# Patient Record
Sex: Male | Born: 1970
Health system: Southern US, Community
[De-identification: ages and names within clinical notes are randomized; demographics above are authoritative.]

## PROBLEM LIST (undated history)

## (undated) DIAGNOSIS — K4090 Unilateral inguinal hernia, without obstruction or gangrene, not specified as recurrent: Secondary | ICD-10-CM

## (undated) DIAGNOSIS — F329 Major depressive disorder, single episode, unspecified: Secondary | ICD-10-CM

## (undated) DIAGNOSIS — F419 Anxiety disorder, unspecified: Secondary | ICD-10-CM

## (undated) DIAGNOSIS — F32A Depression, unspecified: Secondary | ICD-10-CM

## (undated) DIAGNOSIS — I10 Essential (primary) hypertension: Secondary | ICD-10-CM

## (undated) DIAGNOSIS — F1910 Other psychoactive substance abuse, uncomplicated: Secondary | ICD-10-CM

## (undated) HISTORY — PX: MOUTH SURGERY: SHX715

## (undated) HISTORY — DX: Other psychoactive substance abuse, uncomplicated: F19.10

## (undated) HISTORY — DX: Major depressive disorder, single episode, unspecified: F32.9

## (undated) HISTORY — PX: HERNIA REPAIR: SHX51

## (undated) HISTORY — DX: Anxiety disorder, unspecified: F41.9

## (undated) HISTORY — DX: Depression, unspecified: F32.A

## (undated) HISTORY — DX: Essential (primary) hypertension: I10

---

## 2000-08-30 HISTORY — PX: KNEE ARTHRODESIS: SHX1857

## 2011-12-11 ENCOUNTER — Encounter (HOSPITAL_COMMUNITY): Payer: Self-pay

## 2011-12-11 ENCOUNTER — Emergency Department (HOSPITAL_COMMUNITY)
Admission: EM | Admit: 2011-12-11 | Discharge: 2011-12-11 | Disposition: A | Discharge status: Home / Self Care | Payer: Self-pay | Source: Home / Self Care | Attending: Emergency Medicine | Admitting: Emergency Medicine

## 2011-12-11 DIAGNOSIS — I10 Essential (primary) hypertension: Secondary | ICD-10-CM

## 2011-12-11 DIAGNOSIS — K047 Periapical abscess without sinus: Secondary | ICD-10-CM

## 2011-12-11 MED ORDER — PENICILLIN V POTASSIUM 500 MG PO TABS: 500.0000 mg | ORAL_TABLET | Freq: Four times a day (QID) | ORAL | Status: AC

## 2011-12-11 MED ORDER — AMLODIPINE BESYLATE 10 MG PO TABS: 10.0000 mg | ORAL_TABLET | Freq: Every day | ORAL | Status: DC

## 2011-12-11 MED ORDER — DICLOFENAC SODIUM 75 MG PO TBEC: 75.0000 mg | DELAYED_RELEASE_TABLET | Freq: Two times a day (BID) | ORAL | Status: AC

## 2011-12-11 MED ORDER — CLONIDINE HCL 0.1 MG PO TABS
ORAL_TABLET | ORAL | Status: AC
  Filled 2011-12-11: qty 2

## 2011-12-11 MED ORDER — HYDROCODONE-ACETAMINOPHEN 5-325 MG PO TABS: ORAL_TABLET | ORAL | Status: AC

## 2011-12-11 MED ORDER — CLONIDINE HCL 0.1 MG PO TABS
0.2000 mg | ORAL_TABLET | Freq: Once | ORAL | Status: AC
  Administered 2011-12-11: 0.2 mg via ORAL

## 2011-12-11 NOTE — Discharge Instructions (Signed)
Look up the [facility]'s [facility] for free dental clinics. [email or web address]  Get there early and be prepared to wait. Caralyn Guile and [employer] have Armed forces operational officer schools that provide low cost routine dental care.   Other resources: [facility] [address] [place], [place] [phone]  Patients with Medicaid: [facility] [phone]                                [phone] [address] [employer] Phone:  [phone]                                                  Phone:  [phone]  If unable to pay or uninsured, contact:  [facility] or [facility]. to become qualified for the adult dental clinic.  No matter what dental problem you have, it will not get better unless you get good dental care.  If the tooth is not taken care of, your symptoms will come back in time and you will be visiting [place] again in the Urgent Care Center with a bad toothache.  So, see your dentist as soon as possible.  If you don't have a dentist, we can give you a list of dentists.  Sometimes the most cost effective treatment is removal of the tooth.  This can be done very inexpensively through one of the low cost Runner, broadcasting/film/video such as the facility on [facility] in [place] [phone]).  The downside to this is that you will have one less tooth and this can effect your ability to chew.  Some other things that can be done for a dental infection include the following:   Rinse your mouth out with hot salt water (1/2 tsp of table salt and a pinch of baking soda in 8 oz of hot water).  You can do this every 2 or 3 hours.  Avoid cold foods, beverages, and cold air.  This will make your symptoms worse.  Sleep with your head elevated.  Sleeping flat will cause your gums and oral tissues to swell and make them hurt more.  You can sleep on several pillows.  Even  better is to sleep in a recliner with your head higher than your heart.  For mild to moderate pain, you can take Tylenol, ibuprofen, or Aleve.  External application of heat by a heating pad, hot water bottle, or hot wet towel can help with pain and speed healing.  You can do this every 2 to 3 hours. Do not fall asleep on a heating pad since this can cause a burn.    Blood pressure over the ideal can put you at higher risk for stroke, heart disease, and kidney failure.  For this reason, it's important to try to get your blood pressure as close as possible to the ideal.  The ideal blood pressure is 120/80.  Blood pressures from 120-139 systolic over 80-89 diastolic are labeled as "prehypertension."  This means you are at higher risk of developing hypertension in the future.  Blood pressures in this range are not treated with medication, but lifestyle changes  are recommended to prevent progression to hypertension.  Blood pressures of 140 and above systolic over 90 and above diastolic are classified as hypertension and are treated with medications.  Lifestyle changes which can benefit both prehypertension and hypertension include the following:   Salt and sodium restriction.  Weight loss.  Regular exercise.  Avoidance of tobacco.  Avoidance of excess alcohol.  The "D.A.S.H" diet.   People with hypertension and prehypertension should limit their salt intake to less than 1500 mg daily.  Reading the nutrition information on the label of many prepared foods can give you an idea of how much sodium you're consuming at each meal.  Remember that the most important number on the nutrition information is the serving size.  It may be smaller than you think.  Try to avoid adding extra salt at the table.  You may add small amounts of salt while cooking.  Remember that salt is an acquired taste and you may get used to a using a whole lot less salt than you are using now.  Using less salt lets the food's  natural flavors come through.  You might want to consider using salt substitutes, potassium chloride, pepper, or blends of herbs and spices to enhance the flavor of your food.  Foods that contain the most salt include: processed meats (like ham, bacon, lunch meat, sausage, hot dogs, and breakfast meat), chips, pretzels, salted nuts, soups, salty snacks, canned foods, junk food, fast food, restaurant food, mustard, pickles, pizza, popcorn, soy sauce, and worcestershire sauce--quite a list!  You might ask, "Is there anything I can eat?"  The answer is, "yes."  Fruits and vegetables are usually low in salt.  Fresh is better than frozen which is better than canned.  If you have canned vegetables, you can cut down on the salt content by rinsing them in tap water 3 times before cooking.     Weight loss is the second thing you can do to lower your blood pressure.  Getting to and maintaining ideal weight will often normalize your blood pressure and allow you to avoid medications, entirely, cut way down on your dosage of medications, or allow to wean off your meds.  (Note, this should only be done under the supervision of your primary care doctor.)  Of course, weight loss takes time and you may need to be on medication in the meantime.  You shoot for a body mass index of 20-25.  When you go to the urgent care or to your primary care doctor, they should calculate your BMI.  If you don't know what it is, ask.  You can calculate your BMI with the following formula:  Weight in pounds x 703/ (height in inches) x (height in inches).  There are many good diets out there: Weight Watchers and the D.A.S.H. Diet are the best, but often, just modifying a few factors can be helpful:  Don't skip meals, don't eat out, and keeping a food diary.  I do not recommend fad diets or diet pills which often raise blood pressure.    Everyone should get regular exercise, but this is particularly important for people with high blood pressure.   Just about any exercise is good.  The only exercise which may be harmful is lifting extreme heavy weights.  I recommend moderate exercise such as walking for 30 minutes 5 days a week.  Going to the gym for a 50 minute workout 3 times a week is also good.  This amounts to  150 minutes of exercise weekly.   Anyone with high blood pressure should avoid any use of tobacco.  Tobacco use does not elevate blood pressure, but it increases the risk of heart disease and stroke.  If you are interested in quitting, discuss with your doctor how to quit.  If you are not interested in quitting, ask yourself, "What would my life be like in 10 years if I continue to smoke?"  "How will I know when it is time to quit?"  "How would my life be better if I were to quit."   Excess alcohol intake can raise the blood pressure.  The safe alcohol intake is 2 drinks or less per day for men and 1 drink per day or less for women.   There is a very good diet which I recommend that has been designed for people with blood pressure called the D.A.S.H. Diet (dietary approaches to stop hypertension).  It consists of fruits, vegetables, lean meats, low fat dairy, whole grains, nuts and seeds.  It is very low in salt and sodium.  It has also been found to have other beneficial health effects such as lowering cholesterol and helping lose weight.  It has been developed by the [employer] and can be downloaded from the internet without any cost. Just do a Programmer, multimedia on "D.A.S.H. Diet." or go the NIH website (GolfingPosters.tn).  There are also cookbooks and diet plans that can be gotten from [place] to help you with this diet.    Go to www.goodrx.com to look up your medications. This will give you a list of where you can find your prescriptions at the most affordable prices.   RESOURCE GUIDE  Dental Problems  Look up DebtSupply.pl.asp for a schedule of the [facility] [facility]'s free dental clinics  called [address] of [place]. They have clinics all around [place]. Get there early and be prepared to wait.   [facility] [address]  [place], [place] [address] [phone]  [facility] [address] [place], [place] [phone]  Patients with Medicaid: [facility] [phone] [address]                                [phone] [address] [employer] Phone:  [phone]                                                  Phone:  [phone]  If unable to pay or uninsured, contact:  [facility] or [facility]. to become qualified for the adult dental clinic.   Go to www.goodrx.com to look up your medications. This will give you a list of where you can find your prescriptions at the most affordable prices.   RESOURCE GUIDE  Chronic Pain Problems Contact [facility]  [phone] Patients need to be referred by their primary care doctor.  Insufficient Money for Medicine Contact [employer]:  call "211" or [facility] [phone].  No Primary Care Doctor Call [facility]  [phone] Other agencies that provide inexpensive medical care    [facility]  [phone]    [facility]  [phone]    [facility]  [phone]    [facility]  [phone] [address] [place] [address]    [facility]  [phone]    [facility]  [phone] [facility] [phone]   [address] [place]. [address] [place], [place] [address]  Psychological Services [facility]  [phone] [facility]  [phone] [facility]   [phone] (emergency services [phone])  Substance Abuse Resources [facility]  [phone] [facility] [phone] [facility] [phone] Floydene Flock [phone] Residential & Outpatient Substance  Abuse Program  [phone]  Abuse/Neglect [facility] [place] Child Abuse Hotline (479)517-4327 [facility] Child Abuse Hotline (309)736-5715 (After Hours)  Emergency Shelter [facility] [phone]  Maternity Homes Room at the [place] [facility] [place] [phone] [facility] [phone]  MRSA Hotline #:   7432999839                                                Cristobal Goldmann Phone:  [phone]                                   Phone:  [phone]                 Phone:  [phone]  [facility] Phone:  [phone]  [facility] Child Abuse Hotline [phone]    [facility] Resources  [facility] [place]     [employer]                          [facility] [address] [place]                       [address]      [address] [place] [address]   [phone] (After Hours)

## 2011-12-11 NOTE — ED Provider Notes (Signed)
Chief Complaint  Patient presents with  . Dental Pain    History of Present Illness:   George Campbell is a 41 year old male with high blood pressure who has had a one-week history of a toothache in his left upper second premolar. There is no swelling, but he does have a bad taste in his mouth. It hurts to and he has some sinus pain. No trouble swallowing or breathing. No headache, fever, chills, or shortness of breath.  He has a history of high blood pressure, but is not now on any blood pressure medication. He denies headaches, dizziness, shortness of breath, or chest pain.  Review of Systems:  Other than noted above, the patient denies any of the following symptoms: Systemic:  No fever, chills, sweats or weight loss. ENT:  No headache, ear ache, sore throat, nasal congestion, facial pain, or swelling. Lymphatic:  No adenopathy. Lungs:  No coughing, wheezing or shortness of breath.  PMFSH:  Past medical history, family history, social history, meds, and allergies were reviewed.  Physical Exam:   Vital signs:  BP 157/109  Pulse 84  Temp(Src) 97.8 F (36.6 C) (Oral)  Resp 20  SpO2 99% Filed Vitals:   12/11/11 1931 12/11/11 1932 12/11/11 2003  BP: 205/120 190/133 157/109  Pulse: 84    Temp: 97.8 F (36.6 C)    TempSrc: Oral    Resp: 20    SpO2: 99%      General:  Alert, oriented, in no distress. ENT:  TMs and canals normal.  Nasal mucosa normal. Mouth exam:  He has multiple carious teeth. There is a large cavity in the left, upper, second premolar. No swelling of the gingiva, no purulent drainage, no swelling of the floor the mouth, and the pharynx is clear. Neck:  No swelling or adenopathy. Lungs:  Breath sounds clear and equal bilaterally.  No wheezes, rales or rhonchi. Heart:  Regular rhythm.  No gallops or murmers. Skin:  Clear, warm and dry.  Course in Urgent Care Center:   He was given clonidine 0.2 mg by mouth and his blood pressure came down as demonstrated  above.  Assessment:  The primary encounter diagnosis was Dental abscess. A diagnosis of Hypertension was also pertinent to this visit.  Plan:   1.  The following meds were prescribed:   New Prescriptions   AMLODIPINE (NORVASC) 10 MG TABLET    Take 1 tablet (10 mg total) by mouth daily.   DICLOFENAC (VOLTAREN) 75 MG EC TABLET    Take 1 tablet (75 mg total) by mouth 2 (two) times daily.   HYDROCODONE-ACETAMINOPHEN (NORCO) 5-325 MG PER TABLET    1 to 2 tabs every 4 to 6 hours as needed for pain.   PENICILLIN V POTASSIUM (VEETID) 500 MG TABLET    Take 1 tablet (500 mg total) by mouth 4 (four) times daily.   2.  The patient was instructed in symptomatic care and handouts were given. 3.  The patient was told to return if becoming worse in any way, if no better in 3 or 4 days, and given some red flag symptoms that would indicate earlier return, especially difficulty breathing. 4.  The patient was told to follow up with a dentist as soon as possible. He was also told to followup with her primary care doctor as soon as possible with regard to his blood pressure.    Reuben Likes, MD 12/11/11 [phone]

## 2011-12-11 NOTE — ED Notes (Signed)
Pt has lt sided toothache for one week.  Pts b/p is elevated and states he has been on b/p medication in the past but is presently not taking any medication.

## 2013-04-17 DIAGNOSIS — F988 Other specified behavioral and emotional disorders with onset usually occurring in childhood and adolescence: Secondary | ICD-10-CM | POA: Insufficient documentation

## 2013-08-02 DIAGNOSIS — E6609 Other obesity due to excess calories: Secondary | ICD-10-CM | POA: Insufficient documentation

## 2013-08-02 DIAGNOSIS — E66811 Obesity, class 1: Secondary | ICD-10-CM | POA: Insufficient documentation

## 2013-08-02 DIAGNOSIS — Z6834 Body mass index (BMI) 34.0-34.9, adult: Secondary | ICD-10-CM

## 2013-08-02 DIAGNOSIS — I10 Essential (primary) hypertension: Secondary | ICD-10-CM | POA: Insufficient documentation

## 2015-03-25 DIAGNOSIS — E119 Type 2 diabetes mellitus without complications: Secondary | ICD-10-CM | POA: Insufficient documentation

## 2015-03-25 DIAGNOSIS — E1159 Type 2 diabetes mellitus with other circulatory complications: Secondary | ICD-10-CM | POA: Insufficient documentation

## 2015-03-25 DIAGNOSIS — R7301 Impaired fasting glucose: Secondary | ICD-10-CM | POA: Insufficient documentation

## 2015-03-26 LAB — HM HEPATITIS C SCREENING LAB: HM HEPATITIS C SCREENING: NEGATIVE

## 2015-10-05 DIAGNOSIS — K700 Alcoholic fatty liver: Secondary | ICD-10-CM | POA: Insufficient documentation

## 2016-07-09 DIAGNOSIS — F988 Other specified behavioral and emotional disorders with onset usually occurring in childhood and adolescence: Secondary | ICD-10-CM | POA: Diagnosis not present

## 2016-07-09 DIAGNOSIS — E669 Obesity, unspecified: Secondary | ICD-10-CM | POA: Diagnosis not present

## 2016-07-09 DIAGNOSIS — Z87891 Personal history of nicotine dependence: Secondary | ICD-10-CM | POA: Diagnosis not present

## 2016-07-09 DIAGNOSIS — I10 Essential (primary) hypertension: Secondary | ICD-10-CM | POA: Diagnosis not present

## 2016-09-14 DIAGNOSIS — Z23 Encounter for immunization: Secondary | ICD-10-CM | POA: Diagnosis not present

## 2016-09-14 DIAGNOSIS — F988 Other specified behavioral and emotional disorders with onset usually occurring in childhood and adolescence: Secondary | ICD-10-CM | POA: Diagnosis not present

## 2016-09-14 DIAGNOSIS — I10 Essential (primary) hypertension: Secondary | ICD-10-CM | POA: Diagnosis not present

## 2016-09-14 DIAGNOSIS — Z0001 Encounter for general adult medical examination with abnormal findings: Secondary | ICD-10-CM | POA: Diagnosis not present

## 2016-09-14 DIAGNOSIS — R7301 Impaired fasting glucose: Secondary | ICD-10-CM | POA: Diagnosis not present

## 2016-09-14 LAB — CBC AND DIFFERENTIAL
HEMATOCRIT: 44 (ref 41–53)
HEMOGLOBIN: 15.2 (ref 13.5–17.5)
WBC: 11.2

## 2016-09-14 LAB — HEMOGLOBIN A1C
Hemoglobin A1C: 5.7
Hemoglobin A1C: 5.7

## 2016-09-14 LAB — LIPID PANEL
CHOLESTEROL: 134 (ref 0–200)
HDL: 52 (ref 35–70)
LDL Cholesterol: 53
TRIGLYCERIDES: 147 (ref 40–160)

## 2016-09-14 LAB — HEPATIC FUNCTION PANEL
ALT: 47 — AB (ref 10–40)
AST: 40 (ref 14–40)

## 2016-11-17 DIAGNOSIS — J069 Acute upper respiratory infection, unspecified: Secondary | ICD-10-CM | POA: Diagnosis not present

## 2016-12-14 DIAGNOSIS — F988 Other specified behavioral and emotional disorders with onset usually occurring in childhood and adolescence: Secondary | ICD-10-CM | POA: Diagnosis not present

## 2016-12-14 DIAGNOSIS — I10 Essential (primary) hypertension: Secondary | ICD-10-CM | POA: Diagnosis not present

## 2017-06-15 DIAGNOSIS — E6609 Other obesity due to excess calories: Secondary | ICD-10-CM | POA: Diagnosis not present

## 2017-06-15 DIAGNOSIS — Z6834 Body mass index (BMI) 34.0-34.9, adult: Secondary | ICD-10-CM | POA: Diagnosis not present

## 2017-06-15 DIAGNOSIS — F988 Other specified behavioral and emotional disorders with onset usually occurring in childhood and adolescence: Secondary | ICD-10-CM | POA: Diagnosis not present

## 2017-06-15 DIAGNOSIS — I10 Essential (primary) hypertension: Secondary | ICD-10-CM | POA: Diagnosis not present

## 2017-09-26 ENCOUNTER — Encounter: Payer: Self-pay | Admitting: Family Medicine

## 2017-09-26 ENCOUNTER — Other Ambulatory Visit: Payer: Self-pay | Admitting: *Deleted

## 2017-09-26 ENCOUNTER — Encounter: Payer: Self-pay | Admitting: *Deleted

## 2017-09-26 ENCOUNTER — Ambulatory Visit (INDEPENDENT_AMBULATORY_CARE_PROVIDER_SITE_OTHER): Payer: BLUE CROSS/BLUE SHIELD | Admitting: Family Medicine

## 2017-09-26 VITALS — BP 124/90 | HR 96 | Temp 99.0°F | Ht 69.0 in | Wt 236.2 lb

## 2017-09-26 DIAGNOSIS — Z0000 Encounter for general adult medical examination without abnormal findings: Secondary | ICD-10-CM | POA: Diagnosis not present

## 2017-09-26 DIAGNOSIS — I10 Essential (primary) hypertension: Secondary | ICD-10-CM | POA: Diagnosis not present

## 2017-09-26 DIAGNOSIS — Z114 Encounter for screening for human immunodeficiency virus [HIV]: Secondary | ICD-10-CM

## 2017-09-26 DIAGNOSIS — F988 Other specified behavioral and emotional disorders with onset usually occurring in childhood and adolescence: Secondary | ICD-10-CM | POA: Diagnosis not present

## 2017-09-26 DIAGNOSIS — R7301 Impaired fasting glucose: Secondary | ICD-10-CM

## 2017-09-26 DIAGNOSIS — K760 Fatty (change of) liver, not elsewhere classified: Secondary | ICD-10-CM

## 2017-09-26 LAB — CBC WITH DIFFERENTIAL/PLATELET
BASOS ABS: 0.1 10*3/uL (ref 0.0–0.1)
Basophils Relative: 0.6 % (ref 0.0–3.0)
Eosinophils Absolute: 0.1 10*3/uL (ref 0.0–0.7)
Eosinophils Relative: 0.7 % (ref 0.0–5.0)
HCT: 47.5 % (ref 39.0–52.0)
Hemoglobin: 16.3 g/dL (ref 13.0–17.0)
LYMPHS ABS: 2.1 10*3/uL (ref 0.7–4.0)
Lymphocytes Relative: 19 % (ref 12.0–46.0)
MCHC: 34.3 g/dL (ref 30.0–36.0)
MCV: 90.4 fl (ref 78.0–100.0)
MONO ABS: 1.4 10*3/uL — AB (ref 0.1–1.0)
MONOS PCT: 12.4 % — AB (ref 3.0–12.0)
NEUTROS ABS: 7.6 10*3/uL (ref 1.4–7.7)
NEUTROS PCT: 67.3 % (ref 43.0–77.0)
PLATELETS: 277 10*3/uL (ref 150.0–400.0)
RBC: 5.26 Mil/uL (ref 4.22–5.81)
RDW: 13.1 % (ref 11.5–15.5)
WBC: 11.3 10*3/uL — ABNORMAL HIGH (ref 4.0–10.5)

## 2017-09-26 LAB — LIPID PANEL
CHOL/HDL RATIO: 3
Cholesterol: 140 mg/dL (ref 0–200)
HDL: 54.9 mg/dL (ref >39.00)
LDL Cholesterol: 56 mg/dL (ref 0–99)
NONHDL: 85.05
Triglycerides: 147 mg/dL (ref 0.0–149.0)
VLDL: 29.4 mg/dL (ref 0.0–40.0)

## 2017-09-26 LAB — COMPREHENSIVE METABOLIC PANEL
ALT: 148 U/L — ABNORMAL HIGH (ref 0–53)
AST: 244 U/L — ABNORMAL HIGH (ref 0–37)
Albumin: 4.8 g/dL (ref 3.5–5.2)
Alkaline Phosphatase: 77 U/L (ref 39–117)
BUN: 13 mg/dL (ref 6–23)
CO2: 30 meq/L (ref 19–32)
Calcium: 9.8 mg/dL (ref 8.4–10.5)
Chloride: 96 mEq/L (ref 96–112)
Creatinine, Ser: 0.8 mg/dL (ref 0.40–1.50)
GFR: 111.15 mL/min (ref >60.00)
GLUCOSE: 139 mg/dL — AB (ref 70–99)
POTASSIUM: 4.8 meq/L (ref 3.5–5.1)
SODIUM: 135 meq/L (ref 135–145)
Total Bilirubin: 1.2 mg/dL (ref 0.2–1.2)
Total Protein: 8.4 g/dL — ABNORMAL HIGH (ref 6.0–8.3)

## 2017-09-26 LAB — HEMOGLOBIN A1C: Hgb A1c MFr Bld: 6 % (ref 4.6–6.5)

## 2017-09-26 MED ORDER — AMPHETAMINE-DEXTROAMPHETAMINE 10 MG PO TABS: 10.0000 mg | ORAL_TABLET | Freq: Every day | ORAL | 0 refills | Status: DC | PRN

## 2017-09-26 MED ORDER — LISINOPRIL-HYDROCHLOROTHIAZIDE 20-12.5 MG PO TABS: 2.0000 | ORAL_TABLET | Freq: Every day | ORAL | 3 refills | Status: DC

## 2017-09-26 MED ORDER — AMPHETAMINE-DEXTROAMPHET ER 30 MG PO CP24: 30.0000 mg | ORAL_CAPSULE | Freq: Every day | ORAL | 0 refills | Status: DC

## 2017-09-26 MED ORDER — AMLODIPINE BESYLATE 10 MG PO TABS: 10.0000 mg | ORAL_TABLET | Freq: Every day | ORAL | 0 refills | Status: DC

## 2017-09-26 NOTE — Patient Instructions (Addendum)
It was so good seeing you again! Thank you for establishing with my new practice and allowing me to continue caring for you. It means a lot to me.   Please schedule a follow up appointment with me in 3 months for ADD recheck. Please cancel your appointment for next week. It is no longer needed :).  Start exercising.  Please do these things to maintain good health!   Exercise at least 30-45 minutes a day,  4-5 days a week.   Eat a low-fat diet with lots of fruits and vegetables, up to 7-9 servings per day.  Drink plenty of water daily. Try to drink 8 8oz glasses per day.  Seatbelts can save your life. Always wear your seatbelt.  Place Smoke Detectors on every level of your home and check batteries every year.  Eye Doctor - have an eye exam every 1-2 years  Safe sex - use condoms to protect yourself from STDs if you could be exposed to these types of infections.  Avoid heavy alcohol use. If you drink, keep it to less than 2 drinks/day and not every day.  [place].  Choose someone you trust that could speak for you if you became unable to speak for yourself.  Depression is common in our stressful world.If you're feeling down or losing interest in things you normally enjoy, please come in for a visit.   Exercise to Lose Weight Exercise and a healthy diet may help you lose weight. Your doctor may suggest specific exercises. EXERCISE IDEAS AND TIPS  Choose low-cost things you enjoy doing, such as walking, bicycling, or exercising to workout videos.  Take stairs instead of the elevator.  Walk during your lunch break.  Park your car further away from work or school.  Go to a gym or an exercise class.  Start with 5 to 10 minutes of exercise each day. Build up to 30 minutes of exercise 4 to 6 days a week.  Wear shoes with good support and comfortable clothes.  Stretch before and after working out.  Work out until you breathe harder and your heart beats  faster.  Drink extra water when you exercise.  Do not do so much that you hurt yourself, feel dizzy, or get very short of breath. Exercises that burn about 150 calories:  Running 1  miles in 15 minutes.  Playing volleyball for 45 to 60 minutes.  Washing and waxing a car for 45 to 60 minutes.  Playing touch football for 45 minutes.  Walking 1  miles in 35 minutes.  Pushing a stroller 1  miles in 30 minutes.  Playing basketball for 30 minutes.  Raking leaves for 30 minutes.  Bicycling 5 miles in 30 minutes.  Walking 2 miles in 30 minutes.  Dancing for 30 minutes.  Shoveling snow for 15 minutes.  Swimming laps for 20 minutes.  Walking up stairs for 15 minutes.  Bicycling 4 miles in 15 minutes.  Gardening for 30 to 45 minutes.  Jumping rope for 15 minutes.  Washing windows or floors for 45 to 60 minutes.

## 2017-09-26 NOTE — Progress Notes (Signed)
Subjective  Chief Complaint  Patient presents with  . Establish Care    Transfer from [place]  . ADD  . Hypertension    HPI: George Campbell is a 47 y.o. male who presents to [place] at [facility] today for a Male Wellness Visit. He also has the concerns and/or needs as listed above in the chief complaint. These will be addressed in addition to the Health Maintenance Visit. He is a former [facility] patient and is here to reestablish care with me today.  Reviewed notes from last year and most recent labs: 08/2016  Wellness Visit: annual visit with health maintenance review and exam    Doing well. Finally working day shift instead of night shift at [employer]. Feels better. However, is walking less and has gained about 6 pounds. Due for eye exam. Still using e-cigarettes while driving but no cigarettes in almost 2 years.  Lifestyle: Body mass index is 34.89 kg/m. Wt Readings from Last 3 Encounters:  09/26/17 236 lb 4 oz (107.2 kg)   Diet: general Exercise: rarely, walking  Chronic disease management visit and/or acute problem visit:  Obesity: discussed diet and need for exercise. BMI remains elevated. H/o fatty liver needs recheck. At risk for liver disease and diabetes.   Hypertension f/u: Control is fair . Pt reports he is doing well. taking medications as instructed, no medication side effects noted, home BP monitoring in range of 001V systolic over 49-44H diastolic, no TIAs, no chest pain on exertion, no dyspnea on exertion, no swelling of ankles. He denies adverse effects from his BP medications. Compliance with medication is good. Reviewed bp from last 3 visits: NH - at goal.  Patient is here today for follow up of ADD/ADHD. He is taking medication as directed and continues to feel it is beneficial. The medications continue to help with focus and attention and task completion. He denies adverse side effects; specifically no headaches, appetite suppression, weight loss,  sleeping difficulty, heart palpitations, chest pain or significant weight changes. This patient does not have contraindications for stimulant use include uncontrolled hypertension, tachycardia, arrhythmia, psychosis, bipolar disorder, severe anorexia, and Tourette syndrome.  IFG: due for recheck. Not fasting this am. Will check a1c. Recommend weight loss and to start exercise program. Pt declines nutrition eval at this time.   BP Readings from Last 3 Encounters:  09/26/17 124/90  12/11/11 (!) 157/109   Wt Readings from Last 3 Encounters:  09/26/17 236 lb 4 oz (107.2 kg)    Patient Active Problem List   Diagnosis Date Noted  . Fatty liver disease, nonalcoholic 67/59/1638  . IFG (impaired fasting glucose) 03/25/2015  . Class 1 obesity due to excess calories with serious comorbidity and body mass index (BMI) of 34.0 to 34.9 in adult 08/02/2013  . Essential hypertension with goal blood pressure less than 140/90 08/02/2013  . Attention deficit disorder (ADD) without hyperactivity 04/17/2013   Health Maintenance  Topic Date Due  . HIV Screening  10/27/1987  . TETANUS/TDAP  09/06/2024  . INFLUENZA VACCINE  Completed   Immunization History  Administered Date(s) Administered  . Influenza Nasal 05/15/2013, 05/27/2014  . Influenza, Quadrivalent, Recombinant, Inj, Pf 06/10/2017  . Influenza, Seasonal, Injecte, Preservative Fre 06/02/2015, 05/31/2016  . Pneumococcal Polysaccharide-23 09/14/2016  . Tdap 09/06/2014   We updated and reviewed the patient's past history in detail and it is documented below. Allergies: Patient has No Known Allergies. Past Medical History  has a past medical history of Anxiety, Depression, and Hypertension. Past  Surgical History Patient  has a past surgical history that includes Knee arthrodesis (Right, 2002). Social History Patient  reports that he quit smoking about 21 months ago. His smoking use included cigarettes. He has a 45.00 pack-year smoking history.  he has never used smokeless tobacco. He reports that he does not drink alcohol or use drugs. Family History family history includes Esophageal cancer in his maternal uncle; Healthy in his paternal uncle and son; Hypertension in his mother. Review of Systems: Constitutional: negative for fever or malaise Ophthalmic: negative for photophobia, double vision or loss of vision Cardiovascular: negative for chest pain, dyspnea on exertion, or new LE swelling Respiratory: negative for SOB or persistent cough Gastrointestinal: negative for abdominal pain, change in bowel habits or melena Genitourinary: negative for dysuria or gross hematuria Musculoskeletal: negative for new gait disturbance or muscular weakness Integumentary: negative for new or persistent rashes Neurological: negative for TIA or stroke symptoms Psychiatric: negative for SI or delusions Allergic/Immunologic: negative for hives  Patient Care Team    Relationship Specialty Notifications Start End  Leamon Arnt, MD PCP - General Family Medicine  09/26/17    Objective  Vitals: BP 124/90 (BP Location: Left Arm, Patient Position: Sitting, Cuff Size: Large)   Pulse 96   Temp 99 F (37.2 C) (Oral)   Ht 5' 9"  (1.753 m)   Wt 236 lb 4 oz (107.2 kg)   SpO2 97%   BMI 34.89 kg/m  General:  Well developed, well nourished, no acute distress, overweight Psych:  Alert and orientedx3,normal mood and affect HEENT:  Normocephalic, atraumatic, non-icteric sclera, PERRL, oropharynx is clear without mass or exudate, supple neck without adenopathy, mass or thyromegaly Cardiovascular:  Normal S1, S2, RRR without gallop, rub or murmur, nondisplaced PMI, +2 distal pulses in bilateral upper and lower extremities. Respiratory:  Good breath sounds bilaterally, CTAB with normal respiratory effort Gastrointestinal: normal bowel sounds, soft, non-tender, no noted masses. No HSM MSK: no deformities, contusions. Joints are without erythema or swelling.  Spine and CVA region are nontender Skin:  Warm, no rashes or suspicious lesions noted Neurologic:    Mental status is normal. CN 2-11 are normal. Gross motor and sensory exams are normal. Stable gait. No tremor GU: No inguinal hernias or adenopathy are appreciated bilaterally   Assessment  1. Annual physical exam   2. IFG (impaired fasting glucose)   3. Fatty liver disease, nonalcoholic   4. Essential hypertension with goal blood pressure less than 140/90   5. Attention deficit disorder (ADD) without hyperactivity   6. Screening for HIV (human immunodeficiency virus)      Plan  Male Wellness Visit:  Age appropriate Health Maintenance and Prevention measures were discussed with patient. Included topics are cancer screening recommendations, ways to keep healthy (see AVS) including dietary and exercise recommendations, regular eye and dental care, use of seat belts, and avoidance of moderate alcohol use and tobacco use. Recommended making weight loss a priority. HIV screen.   BMI: discussed patient's BMI and encouraged positive lifestyle modifications to help get to or maintain a target BMI.  HM needs and immunizations were addressed and ordered. See below for orders. See HM and immunization section for updates. Up to date.   Routine labs and screening tests ordered including cmp, cbc and lipids where appropriate.  Discussed recommendations regarding Vit D and calcium supplementation (see AVS)  Chronic disease f/u and/or acute problem visit: (deemed necessary to be done in addition to the wellness visit):  Hypertension follow up: fairly  well controlled. Continue meds as is. Start home monitoring to ensure diastolics remain at goal. Weight loss will help bp control.   IFG: recheck levels today. Discussed better nutrition  ADD: This medical condition is well controlled. There are no signs of complications, medication side effects, or red flags. Patient is instructed to continue the  current treatment plan without change in therapies or medications. Controlled substance contract signed. Recheck 3 months.   Fatty liver: recheck levels.   Follow up: Return in about 3 months (around 12/25/2017) for follow up on ADD.   Commons side effects, risks, benefits, and alternatives for medications and treatment plan prescribed today were discussed, and the patient expressed understanding of the given instructions. Patient is instructed to call or message via MyChart if he/she has any questions or concerns regarding our treatment plan. No barriers to understanding were identified. We discussed Red Flag symptoms and signs in detail. Patient expressed understanding regarding what to do in case of urgent or emergency type symptoms.   Medication list was reconciled, printed and provided to the patient in AVS. Patient instructions and summary information was reviewed with the patient as documented in the AVS. This note was prepared with assistance of Dragon voice recognition software. Occasional wrong-word or sound-a-like substitutions may have occurred due to the inherent limitations of voice recognition software  Orders Placed This Encounter  Procedures  . CBC with Differential/Platelet  . Comprehensive metabolic panel  . Lipid panel  . HIV antibody  . Hemoglobin A1c   Meds ordered this encounter  Medications  . DISCONTD: amphetamine-dextroamphetamine (ADDERALL XR) 30 MG 24 hr capsule    Sig: Take 1 capsule (30 mg total) by mouth daily.    Dispense:  30 capsule    Refill:  0  . DISCONTD: amphetamine-dextroamphetamine (ADDERALL) 10 MG tablet    Sig: Take 1 tablet (10 mg total) by mouth daily as needed.    Dispense:  30 tablet    Refill:  0  . amLODipine (NORVASC) 10 MG tablet    Sig: Take 1 tablet (10 mg total) by mouth daily.    Dispense:  30 tablet    Refill:  0  . lisinopril-hydrochlorothiazide (PRINZIDE,ZESTORETIC) 20-12.5 MG tablet    Sig: Take 2 tablets by mouth daily.     Dispense:  180 tablet    Refill:  3  . DISCONTD: amphetamine-dextroamphetamine (ADDERALL XR) 30 MG 24 hr capsule    Sig: Take 1 capsule (30 mg total) by mouth daily.    Dispense:  30 capsule    Refill:  0  . DISCONTD: amphetamine-dextroamphetamine (ADDERALL) 10 MG tablet    Sig: Take 1 tablet (10 mg total) by mouth daily as needed.    Dispense:  30 tablet    Refill:  0  . amphetamine-dextroamphetamine (ADDERALL XR) 30 MG 24 hr capsule    Sig: Take 1 capsule (30 mg total) by mouth daily.    Dispense:  30 capsule    Refill:  0  . amphetamine-dextroamphetamine (ADDERALL) 10 MG tablet    Sig: Take 1 tablet (10 mg total) by mouth daily as needed.    Dispense:  30 tablet    Refill:  0

## 2017-09-27 ENCOUNTER — Other Ambulatory Visit: Payer: Self-pay | Admitting: Family Medicine

## 2017-09-27 LAB — HIV ANTIBODY (ROUTINE TESTING W REFLEX): HIV 1&2 Ab, 4th Generation: NONREACTIVE

## 2017-10-03 ENCOUNTER — Encounter: Payer: Self-pay | Admitting: Family Medicine

## 2017-11-07 ENCOUNTER — Ambulatory Visit: Payer: Self-pay | Admitting: Family Medicine

## 2017-11-08 ENCOUNTER — Encounter: Payer: Self-pay | Admitting: Emergency Medicine

## 2017-11-13 DIAGNOSIS — H1089 Other conjunctivitis: Secondary | ICD-10-CM | POA: Diagnosis not present

## 2017-11-13 DIAGNOSIS — J3089 Other allergic rhinitis: Secondary | ICD-10-CM | POA: Diagnosis not present

## 2017-11-13 DIAGNOSIS — J019 Acute sinusitis, unspecified: Secondary | ICD-10-CM | POA: Diagnosis not present

## 2017-11-13 DIAGNOSIS — J069 Acute upper respiratory infection, unspecified: Secondary | ICD-10-CM | POA: Diagnosis not present

## 2017-11-14 ENCOUNTER — Ambulatory Visit: Payer: Self-pay | Admitting: Family Medicine

## 2017-11-18 ENCOUNTER — Other Ambulatory Visit: Payer: Self-pay | Admitting: Family Medicine

## 2017-11-21 ENCOUNTER — Other Ambulatory Visit: Payer: Self-pay | Admitting: Emergency Medicine

## 2017-11-21 MED ORDER — AMLODIPINE BESYLATE 10 MG PO TABS: 10.0000 mg | ORAL_TABLET | Freq: Every day | ORAL | 3 refills | Status: DC

## 2017-11-24 ENCOUNTER — Other Ambulatory Visit: Payer: Self-pay

## 2017-11-24 ENCOUNTER — Ambulatory Visit: Payer: BLUE CROSS/BLUE SHIELD | Admitting: Family Medicine

## 2017-11-24 ENCOUNTER — Encounter: Payer: Self-pay | Admitting: Family Medicine

## 2017-11-24 VITALS — BP 130/78 | HR 95 | Temp 98.1°F | Resp 16 | Ht 69.0 in | Wt 232.4 lb

## 2017-11-24 DIAGNOSIS — K7010 Alcoholic hepatitis without ascites: Secondary | ICD-10-CM | POA: Diagnosis not present

## 2017-11-24 DIAGNOSIS — R7301 Impaired fasting glucose: Secondary | ICD-10-CM

## 2017-11-24 DIAGNOSIS — F988 Other specified behavioral and emotional disorders with onset usually occurring in childhood and adolescence: Secondary | ICD-10-CM

## 2017-11-24 DIAGNOSIS — E6609 Other obesity due to excess calories: Secondary | ICD-10-CM

## 2017-11-24 DIAGNOSIS — I10 Essential (primary) hypertension: Secondary | ICD-10-CM | POA: Diagnosis not present

## 2017-11-24 DIAGNOSIS — F1021 Alcohol dependence, in remission: Secondary | ICD-10-CM | POA: Insufficient documentation

## 2017-11-24 DIAGNOSIS — Z6834 Body mass index (BMI) 34.0-34.9, adult: Secondary | ICD-10-CM

## 2017-11-24 DIAGNOSIS — K760 Fatty (change of) liver, not elsewhere classified: Secondary | ICD-10-CM

## 2017-11-24 LAB — HEPATIC FUNCTION PANEL
ALT: 153 U/L — AB (ref 0–53)
AST: 136 U/L — ABNORMAL HIGH (ref 0–37)
Albumin: 4.3 g/dL (ref 3.5–5.2)
Alkaline Phosphatase: 66 U/L (ref 39–117)
BILIRUBIN DIRECT: 0.2 mg/dL (ref 0.0–0.3)
TOTAL PROTEIN: 8 g/dL (ref 6.0–8.3)
Total Bilirubin: 0.8 mg/dL (ref 0.2–1.2)

## 2017-11-24 MED ORDER — AMPHETAMINE-DEXTROAMPHET ER 30 MG PO CP24: 30.0000 mg | ORAL_CAPSULE | Freq: Every day | ORAL | 0 refills | Status: DC

## 2017-11-24 MED ORDER — AMPHETAMINE-DEXTROAMPHETAMINE 10 MG PO TABS: 10.0000 mg | ORAL_TABLET | Freq: Every day | ORAL | 0 refills | Status: DC | PRN

## 2017-11-24 NOTE — Progress Notes (Signed)
Subjective  CC:  Chief Complaint  Patient presents with  . Follow-up    Lab results and BP check  . Elevated Hepatic Enzymes  . ADHD  . Obesity    HPI: George Campbell is a 47 y.o. male who presents to the office today for follow up of the problems listed above in the chief complaint.   Last visit with very elevated liver tests: here for f/u: admits to resurgence of alcoholism; has always been a drinker but had problems about 8 years ago during his divorce; then got better but over last year, has started drinking heavily again. Now sober x 1 month since reports of increased lfts. Going to AA. Doing well. No abdominal pain, swelling or hematemesis.   Obesity: changing diet as well. Eating veggies and fish. Has lost some weight.   Prediabetes: working hard on diet. Last a1c below.   ADD: stable and requesting refills. Has controlled substance contract on chart.   Immunization History  Administered Date(s) Administered  . Influenza Nasal 05/15/2013, 05/27/2014  . Influenza, Quadrivalent, Recombinant, Inj, Pf 06/10/2017  . Influenza, Seasonal, Injecte, Preservative Fre 06/02/2015, 05/31/2016  . Pneumococcal Polysaccharide-23 09/14/2016  . Tdap 09/06/2014    Diabetes Related Lab Review: Lab Results  Component Value Date   HGBA1C 6.0 09/26/2017   No results found for: Derl Barrow Lab Results  Component Value Date   CREATININE 0.80 09/26/2017   BUN 13 09/26/2017   NA 135 09/26/2017   K 4.8 09/26/2017   CL 96 09/26/2017   CO2 30 09/26/2017   Lab Results  Component Value Date   CHOL 140 09/26/2017   CHOL 134 09/14/2016   Lab Results  Component Value Date   HDL 54.90 09/26/2017   HDL 52 09/14/2016   Lab Results  Component Value Date   LDLCALC 56 09/26/2017   LDLCALC 53 09/14/2016   Lab Results  Component Value Date   TRIG 147.0 09/26/2017   TRIG 147 09/14/2016   Lab Results  Component Value Date   CHOLHDL 3 09/26/2017   Lab Results  Component  Value Date   ALT 148 (H) 09/26/2017   AST 244 (H) 09/26/2017   ALKPHOS 77 09/26/2017   BILITOT 1.2 09/26/2017    No results found for: LDLDIRECT The 10-year ASCVD risk score [name], et al., 2013) is: 1.2%   Values used to calculate the score:     Age: 77 years     Sex: Male     Is Non-Hispanic African American: No     Diabetic: No     Tobacco smoker: No     Systolic Blood Pressure: 149 mmHg     Is BP treated: Yes     HDL Cholesterol: 54.9 mg/dL     Total Cholesterol: 140 mg/dL I have reviewed the [place], Fam and Soc history. Patient Active Problem List   Diagnosis Date Noted  . Alcoholism in recovery ([place]) 11/24/2017  . Fatty liver disease, nonalcoholic 70/26/3785  . IFG (impaired fasting glucose) 03/25/2015  . Class 1 obesity due to excess calories with serious comorbidity and body mass index (BMI) of 34.0 to 34.9 in adult 08/02/2013  . Essential hypertension with goal blood pressure less than 140/90 08/02/2013  . Attention deficit disorder (ADD) without hyperactivity 04/17/2013    Social History: Patient  reports that he quit smoking about 1 years ago. His smoking use included cigarettes. He has a 45.00 pack-year smoking history. He has never used smokeless tobacco. He reports  that he drank alcohol. He reports that he does not use drugs.  Review of Systems: Ophthalmic: negative for eye pain, loss of vision or double vision Cardiovascular: negative for chest pain Respiratory: negative for SOB or persistent cough Gastrointestinal: negative for abdominal pain Genitourinary: negative for dysuria or gross hematuria MSK: negative for foot lesions Neurologic: negative for weakness or gait disturbance Wt Readings from Last 3 Encounters:  11/24/17 232 lb 6.4 oz (105.4 kg)  09/26/17 236 lb 4 oz (107.2 kg)    Objective  Vitals: BP 130/78   Pulse 95   Temp 98.1 F (36.7 C) (Oral)   Resp 16   Ht 5' 9"  (1.753 m)   Wt 232 lb 6.4 oz (105.4 kg)   SpO2 99%   BMI 34.32 kg/m    General: well appearing, no acute distress  Psych:  Alert and oriented, normal mood and affect HEENT:  Normocephalic, atraumatic, moist mucous membranes, supple neck  Cardiovascular:  Nl S1 and S2, RRR without murmur, gallop or rub. no edema Respiratory:  Good breath sounds bilaterally, CTAB with normal effort, no rales Gastrointestinal: normal BS, soft, nontender, no HSM, no masses Skin:  Warm, no rashes Neurologic:   Mental status is normal. normal gait Foot exam: no erythema, pallor, or cyanosis visible nl proprioception and sensation to monofilament testing bilaterally, +2 distal pulses bilaterally    Assessment  1. Fatty liver disease, nonalcoholic   2. IFG (impaired fasting glucose)   3. Class 1 obesity due to excess calories with serious comorbidity and body mass index (BMI) of 34.0 to 34.9 in adult   4. Essential hypertension with goal blood pressure less than 140/90   5. Alcoholic hepatitis without ascites   6. Attention deficit disorder (ADD) without hyperactivity      Plan    Alcoholic hepatitis: recheck; should be improved. Counseled on alcoholism and recovery and health effects of alcohol. Continue AA  bp is controlled; a little high since recenlty on steroids.  Obesity and fatty liver: working hard on improving diet. Low fat.  Pre-Diabetes - diet changes emphasized. Will recheck levels next visit. \  ADD: refilled meds for next 3 months- due for f/u then in 4 months.   Follow up: Return in about 4 months (around 03/26/2018) for follow up on ADD.[name]   Commons side effects, risks, benefits, and alternatives for medications and treatment plan prescribed today were discussed, and the patient expressed understanding of the given instructions. Patient is instructed to call or message via MyChart if he/she has any questions or concerns regarding our treatment plan. No barriers to understanding were identified. We discussed Red Flag symptoms and signs in detail. Patient  expressed understanding regarding what to do in case of urgent or emergency type symptoms.   Medication list was reconciled, printed and provided to the patient in AVS. Patient instructions and summary information was reviewed with the patient as documented in the AVS. This note was prepared with assistance of Dragon voice recognition software. Occasional wrong-word or sound-a-like substitutions may have occurred due to the inherent limitations of voice recognition software  Orders Placed This Encounter  Procedures  . Hepatic function panel   Meds ordered this encounter  Medications  . DISCONTD: amphetamine-dextroamphetamine (ADDERALL XR) 30 MG 24 hr capsule    Sig: Take 1 capsule (30 mg total) by mouth daily.    Dispense:  30 capsule    Refill:  0  . DISCONTD: amphetamine-dextroamphetamine (ADDERALL) 10 MG tablet    Sig: Take 1  tablet (10 mg total) by mouth daily as needed.    Dispense:  30 tablet    Refill:  0  . DISCONTD: amphetamine-dextroamphetamine (ADDERALL XR) 30 MG 24 hr capsule    Sig: Take 1 capsule (30 mg total) by mouth daily.    Dispense:  30 capsule    Refill:  0  . DISCONTD: amphetamine-dextroamphetamine (ADDERALL) 10 MG tablet    Sig: Take 1 tablet (10 mg total) by mouth daily as needed.    Dispense:  30 tablet    Refill:  0  . amphetamine-dextroamphetamine (ADDERALL XR) 30 MG 24 hr capsule    Sig: Take 1 capsule (30 mg total) by mouth daily.    Dispense:  30 capsule    Refill:  0  . amphetamine-dextroamphetamine (ADDERALL) 10 MG tablet    Sig: Take 1 tablet (10 mg total) by mouth daily as needed.    Dispense:  30 tablet    Refill:  0

## 2017-11-24 NOTE — Patient Instructions (Signed)
Please return in 4 months for ADD recheck.  If you have any questions or concerns, please don't hesitate to send me a message via MyChart or call the office at [phone]. Thank you for visiting with [place] today! It's our pleasure caring for you.  Alcoholic Hepatitis Alcoholic hepatitis is liver inflammation caused by drinking alcohol. This inflammation decreases the liver's ability to function normally. What are the causes? Alcoholic hepatitis is caused by heavy drinking. What increases the risk? You may have an increased risk of alcoholic hepatitis if:  You drink large amounts of alcohol.  You have been drinking heavily for years.  You binge drink.  You are male.  You are obese.  You have had infectious hepatitis.  You are malnourished.  You have close family members who have had alcoholic hepatitis.  What are the signs or symptoms?  Abdominal pain.  A swollen abdomen.  Loss of appetite.  Unintentional weight loss.  Nausea and vomiting.  Tiredness.  Dry mouth.  Severe thirst.  A yellow tone to the skin and whites of the eyes (jaundice).  Spidery veins, especially across the skin of the abdomen.  Unusual bleeding.  Itching.  Trouble thinking clearly.  Memory problems.  Mood changes.  Confusion.  Numbness and tingling in the feet and legs. How is this diagnosed? Alcoholic hepatitis is diagnosed with blood tests that show problems with liver function. Additional tests may also be done, such as:  An ultrasound.  A CT scan.  An MRI scan.  A liver biopsy. For this test, a small sample of the liver will be taken and examined for evidence of liver damage.  How is this treated? The most important step you can take to treat alcoholic hepatitis is to stop drinking alcohol. If you are addicted to alcohol, your health care provider will help you create a plan to quit. It may involve:  Taking medicine to decrease withdrawal symptoms.  Entering a  program to help you stop drinking.  Joining a support group.  Additional treatment for alcoholic hepatitis may include:  Medicines such as steroids. The medicines will help decrease the inflammation.  Diet. Your health care provider might ask you to undergo nutritional counseling and follow a diet. You may also need to take dietary supplements and vitamins.  A liver transplant. This procedure is performed in very severe cases. It is only performed on people who have totally stopped drinking and can commit to never drinking alcohol again.  Follow these instructions at home:  Do not drink alcohol.  Do not use medicines or eat foods that contain alcohol.  Take medicines only as directed by your health care provider.  Follow dietary instructions carefully.  Keep all follow-up visits as directed by your health care provider. This is important. Contact a health care provider if:  You have a fever.  You do not have your usual appetite.  You have flu-like symptoms such as fatigue, weakness, or muscle aches.  You feel nauseous or vomit.  You bruise easily.  Your urine is very dark.  You have new abdominal pain. Get help right away if:  There is blood in your vomit.  You develop jaundice.  Your skin itches severely.  Your legs swell.  Your stomach appears bloated.  You have black, tarry-appearing stools.  You bleed easily.  You are confused or not thinking clearly.  You have a seizure. This information is not intended to replace advice given to you by your health care provider. Make sure  you discuss any questions you have with your health care provider. Document Released: 03/13/2014 Document Revised: 01/22/2016 Document Reviewed: 10/10/2013 Elsevier Interactive Patient Education  [employer].

## 2017-12-26 ENCOUNTER — Ambulatory Visit: Payer: BLUE CROSS/BLUE SHIELD | Admitting: Family Medicine

## 2018-03-21 ENCOUNTER — Encounter: Payer: Self-pay | Admitting: Family Medicine

## 2018-03-21 ENCOUNTER — Other Ambulatory Visit: Payer: Self-pay

## 2018-03-21 ENCOUNTER — Ambulatory Visit: Payer: BLUE CROSS/BLUE SHIELD | Admitting: Family Medicine

## 2018-03-21 VITALS — BP 120/84 | HR 104 | Temp 98.1°F | Ht 69.0 in | Wt 233.4 lb

## 2018-03-21 DIAGNOSIS — K7010 Alcoholic hepatitis without ascites: Secondary | ICD-10-CM

## 2018-03-21 DIAGNOSIS — F988 Other specified behavioral and emotional disorders with onset usually occurring in childhood and adolescence: Secondary | ICD-10-CM | POA: Diagnosis not present

## 2018-03-21 DIAGNOSIS — I10 Essential (primary) hypertension: Secondary | ICD-10-CM | POA: Diagnosis not present

## 2018-03-21 DIAGNOSIS — R7301 Impaired fasting glucose: Secondary | ICD-10-CM

## 2018-03-21 DIAGNOSIS — F1020 Alcohol dependence, uncomplicated: Secondary | ICD-10-CM | POA: Diagnosis not present

## 2018-03-21 LAB — HEPATIC FUNCTION PANEL
ALBUMIN: 4.6 g/dL (ref 3.5–5.2)
ALT: 96 U/L — AB (ref 0–53)
AST: 83 U/L — ABNORMAL HIGH (ref 0–37)
Alkaline Phosphatase: 74 U/L (ref 39–117)
Bilirubin, Direct: 0.1 mg/dL (ref 0.0–0.3)
Total Bilirubin: 0.5 mg/dL (ref 0.2–1.2)
Total Protein: 8.5 g/dL — ABNORMAL HIGH (ref 6.0–8.3)

## 2018-03-21 LAB — POCT GLYCOSYLATED HEMOGLOBIN (HGB A1C): HEMOGLOBIN A1C: 5.9 % — AB (ref 4.0–5.6)

## 2018-03-21 MED ORDER — AMPHETAMINE-DEXTROAMPHET ER 30 MG PO CP24: 30.0000 mg | ORAL_CAPSULE | Freq: Every day | ORAL | 0 refills | Status: DC

## 2018-03-21 MED ORDER — AMPHETAMINE-DEXTROAMPHETAMINE 10 MG PO TABS: 10.0000 mg | ORAL_TABLET | Freq: Every day | ORAL | 0 refills | Status: DC | PRN

## 2018-03-21 MED ORDER — AMPHETAMINE-DEXTROAMPHETAMINE 10 MG PO TABS
10.0000 mg | ORAL_TABLET | Freq: Every day | ORAL | 0 refills | Status: DC | PRN
Start: 2018-03-21 — End: 2018-03-21

## 2018-03-21 NOTE — Patient Instructions (Addendum)
Please return in 3 months for recheck blood pressure and ADD   If you have any questions or concerns, please don't hesitate to send me a message via MyChart or call the office at [phone]. Thank you for visiting with [place] today! It's our pleasure caring for you.  Read the book "the Naked Mind" Call your sponsor daily for awhile.  Get a counselor again, addiction specialist.
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
## 2018-03-23 LAB — HEPATITIS B SURFACE ANTIBODY, QUANTITATIVE: Hepatitis B-Post: <5 m[IU]/mL — ABNORMAL LOW (ref >=10)

## 2018-03-23 LAB — HEPATITIS C ANTIBODY
Hepatitis C Ab: NONREACTIVE
SIGNAL TO CUT-OFF: 0.03 (ref <1.00)

## 2018-03-23 LAB — HEPATITIS B SURFACE ANTIGEN: Hepatitis B Surface Ag: NONREACTIVE

## 2018-03-23 NOTE — Progress Notes (Signed)
Labs reviewed. Stable labs to be discussed at next visit. No med changes.  06/19/2018; neg hep b and hep C. rec hep b vaccination since not immune

## 2018-03-27 ENCOUNTER — Ambulatory Visit: Payer: BLUE CROSS/BLUE SHIELD | Admitting: Family Medicine

## 2018-05-07 ENCOUNTER — Telehealth: Payer: BLUE CROSS/BLUE SHIELD | Admitting: Family

## 2018-05-07 DIAGNOSIS — J208 Acute bronchitis due to other specified organisms: Secondary | ICD-10-CM

## 2018-05-07 MED ORDER — BENZONATATE 100 MG PO CAPS: 100.0000 mg | ORAL_CAPSULE | Freq: Three times a day (TID) | ORAL | 0 refills | Status: DC | PRN

## 2018-05-07 NOTE — Progress Notes (Signed)
We are sorry that you are not feeling well.  Here is how we plan to help!  Based on your presentation I believe you most likely have A cough due to a virus.  This is called viral bronchitis and is best treated by rest, plenty of fluids and control of the cough.  You may use Ibuprofen or Tylenol as directed to help your symptoms.     In addition you may use A non-prescription cough medication called Robitussin DAC. Take 2 teaspoons every 8 hours or Delsym: take 2 teaspoons every 12 hours., A non-prescription cough medication called Mucinex DM: take 2 tablets every 12 hours. and A prescription cough medication called Tessalon Perles 166m. You may take 1-2 capsules every 8 hours as needed for your cough.    From your responses in the eVisit questionnaire you describe inflammation in the upper respiratory tract which is causing a significant cough.  This is commonly called Bronchitis and has four common causes:    Allergies  Viral Infections  Acid Reflux  Bacterial Infection Allergies, viruses and acid reflux are treated by controlling symptoms or eliminating the cause. An example might be a cough caused by taking certain blood pressure medications. You stop the cough by changing the medication. Another example might be a cough caused by acid reflux. Controlling the reflux helps control the cough.  USE OF BRONCHODILATOR ("RESCUE") INHALERS: There is a risk from using your bronchodilator too frequently.  The risk is that over-reliance on a medication which only relaxes the muscles surrounding the breathing tubes can reduce the effectiveness of medications prescribed to reduce swelling and congestion of the tubes themselves.  Although you feel brief relief from the bronchodilator inhaler, your asthma may actually be worsening with the tubes becoming more swollen and filled with mucus.  This can delay other crucial treatments, such as oral steroid medications. If you need to use a bronchodilator  inhaler daily, several times per day, you should discuss this with your provider.  There are probably better treatments that could be used to keep your asthma under control.     HOME CARE . Only take medications as instructed by your medical team. . Complete the entire course of an antibiotic. . Drink plenty of fluids and get plenty of rest. . Avoid close contacts especially the very young and the elderly . Cover your mouth if you cough or cough into your sleeve. . Always remember to wash your hands . A steam or ultrasonic humidifier can help congestion.   GET HELP RIGHT AWAY IF: . You develop worsening fever. . You become short of breath . You cough up blood. . Your symptoms persist after you have completed your treatment plan MAKE SURE YOU   Understand these instructions.  Will watch your condition.  Will get help right away if you are not doing well or get worse.  Your e-visit answers were reviewed by a board certified advanced clinical practitioner to complete your personal care plan.  Depending on the condition, your plan could have included both over the counter or prescription medications. If there is a problem please reply  once you have received a response from your provider. Your safety is important to [place]  If you have drug allergies check your prescription carefully.    You can use MyChart to ask questions about today's visit, request a non-urgent call back, or ask for a work or school excuse for 24 hours related to this e-Visit. If it has been greater than  24 hours you will need to follow up with your provider, or enter a new e-Visit to address those concerns. You will get an e-mail in the next two days asking about your experience.  I hope that your e-visit has been valuable and will speed your recovery. Thank you for using e-visits.

## 2018-06-19 ENCOUNTER — Ambulatory Visit: Payer: BLUE CROSS/BLUE SHIELD | Admitting: Family Medicine

## 2018-06-21 ENCOUNTER — Encounter: Payer: Self-pay | Admitting: Family Medicine

## 2018-06-21 ENCOUNTER — Ambulatory Visit: Payer: BLUE CROSS/BLUE SHIELD | Admitting: Family Medicine

## 2018-06-21 ENCOUNTER — Other Ambulatory Visit: Payer: Self-pay

## 2018-06-21 VITALS — BP 132/90 | HR 93 | Temp 98.4°F | Ht 69.0 in | Wt 228.4 lb

## 2018-06-21 DIAGNOSIS — I10 Essential (primary) hypertension: Secondary | ICD-10-CM | POA: Diagnosis not present

## 2018-06-21 DIAGNOSIS — Z23 Encounter for immunization: Secondary | ICD-10-CM | POA: Diagnosis not present

## 2018-06-21 DIAGNOSIS — K700 Alcoholic fatty liver: Secondary | ICD-10-CM

## 2018-06-21 DIAGNOSIS — F988 Other specified behavioral and emotional disorders with onset usually occurring in childhood and adolescence: Secondary | ICD-10-CM

## 2018-06-21 MED ORDER — AMPHETAMINE-DEXTROAMPHET ER 30 MG PO CP24: 30.0000 mg | ORAL_CAPSULE | Freq: Every day | ORAL | 0 refills | Status: DC

## 2018-06-21 MED ORDER — AMPHETAMINE-DEXTROAMPHETAMINE 10 MG PO TABS: 10.0000 mg | ORAL_TABLET | Freq: Every day | ORAL | 0 refills | Status: DC | PRN

## 2018-06-21 NOTE — Patient Instructions (Addendum)
Please return in January for your annual complete physical; please come fasting.   If you have any questions or concerns, please don't hesitate to send me a message via MyChart or call the office at [phone]. Thank you for visiting with [place] today! It's our pleasure caring for you.  Continue to eat better: look up whole food plant based diets to learn more about healthy vegetable options/meals. Weight loss will help your liver and blood pressure and elevated sugar readings.

## 2018-06-21 NOTE — Progress Notes (Signed)
Subjective  CC:  Chief Complaint  Patient presents with  . Hypertension    doing well, no complaints. Medication working well, flu today   . ADHD    Medications working well, no complaints    HPI: George Campbell is a 47 y.o. male who presents to the office today to address the problems listed above in the chief complaint.  Patient is here today for follow up of ADD/ADHD. He is taking medication as directed and continues to feel it is beneficial. The medications continue to help with focus and attention and task completion. He denies adverse side effects; specifically no headaches, appetite suppression, weight loss, sleeping difficulty, heart palpitations, chest pain or significant weight changes.  This patient does not have contraindications for stimulant use include hypertension, tachycardia, arrhythmia, psychosis, bipolar disorder, severe anorexia, and Tourette syndrome.  Follow-up fatty liver and alcoholism: Reports he is doing well.  Maintain sobriety.  Is working on eating a healthier diet.  Has lost some weight.  Eating more vegetables.  No right upper quadrant pain.  Reviewed negative hepatitis B immunity.  Recommend hepatitis B vaccination series.  Hep C was negative as well.  Blood pressure has been fairly well-controlled on current medications. Assessment  1. Attention deficit disorder (ADD) without hyperactivity   2. Essential hypertension with goal blood pressure less than 140/90   3. Fatty liver, alcoholic   4. Need for immunization against influenza      Plan   ADD: Stable.  Continue current medications.  Hypertension: Well controlled.  Fatty liver and alcoholism: Continued counseling.  Continue recommending weight loss and low-fat diet.  Update immunizations today including hep A and influenza.  Follow up: Return in about 3 months (around 09/21/2018) for complete physical, follow up on ADD.  Orders Placed This Encounter  Procedures  . Hepatitis B vaccine adult  IM  . Flu Vaccine QUAD 36+ mos IM   Meds ordered this encounter  Medications  . DISCONTD: amphetamine-dextroamphetamine (ADDERALL XR) 30 MG 24 hr capsule    Sig: Take 1 capsule (30 mg total) by mouth daily.    Dispense:  30 capsule    Refill:  0  . DISCONTD: amphetamine-dextroamphetamine (ADDERALL) 10 MG tablet    Sig: Take 1 tablet (10 mg total) by mouth daily as needed.    Dispense:  30 tablet    Refill:  0  . DISCONTD: amphetamine-dextroamphetamine (ADDERALL XR) 30 MG 24 hr capsule    Sig: Take 1 capsule (30 mg total) by mouth daily.    Dispense:  30 capsule    Refill:  0  . DISCONTD: amphetamine-dextroamphetamine (ADDERALL) 10 MG tablet    Sig: Take 1 tablet (10 mg total) by mouth daily as needed.    Dispense:  30 tablet    Refill:  0  . amphetamine-dextroamphetamine (ADDERALL XR) 30 MG 24 hr capsule    Sig: Take 1 capsule (30 mg total) by mouth daily.    Dispense:  30 capsule    Refill:  0  . amphetamine-dextroamphetamine (ADDERALL) 10 MG tablet    Sig: Take 1 tablet (10 mg total) by mouth daily as needed.    Dispense:  30 tablet    Refill:  0      I reviewed the patients updated PMH, FH, and SocHx.    Patient Active Problem List   Diagnosis Date Noted  . Alcoholism in recovery ([place]) 11/24/2017  . Fatty liver, alcoholic 08/22/8249  . IFG (impaired fasting glucose) 03/25/2015  .  Class 1 obesity due to excess calories with serious comorbidity and body mass index (BMI) of 34.0 to 34.9 in adult 08/02/2013  . Essential hypertension with goal blood pressure less than 140/90 08/02/2013  . Attention deficit disorder (ADD) without hyperactivity 04/17/2013   Current Meds  Medication Sig  . amLODipine (NORVASC) 10 MG tablet TAKE 1 TABLET(10 MG) BY MOUTH DAILY  . [START ON 08/20/2018] amphetamine-dextroamphetamine (ADDERALL XR) 30 MG 24 hr capsule Take 1 capsule (30 mg total) by mouth daily.  Derrill Memo ON 08/20/2018] amphetamine-dextroamphetamine (ADDERALL) 10 MG tablet Take 1  tablet (10 mg total) by mouth daily as needed.  [name] lisinopril-hydrochlorothiazide (PRINZIDE,ZESTORETIC) 20-12.5 MG tablet Take 2 tablets by mouth daily.  . [DISCONTINUED] amphetamine-dextroamphetamine (ADDERALL XR) 30 MG 24 hr capsule Take 1 capsule (30 mg total) by mouth daily.  . [DISCONTINUED] amphetamine-dextroamphetamine (ADDERALL XR) 30 MG 24 hr capsule Take 1 capsule (30 mg total) by mouth daily.  . [DISCONTINUED] amphetamine-dextroamphetamine (ADDERALL XR) 30 MG 24 hr capsule Take 1 capsule (30 mg total) by mouth daily.  . [DISCONTINUED] amphetamine-dextroamphetamine (ADDERALL) 10 MG tablet Take 1 tablet (10 mg total) by mouth daily as needed.  . [DISCONTINUED] amphetamine-dextroamphetamine (ADDERALL) 10 MG tablet Take 1 tablet (10 mg total) by mouth daily as needed.  . [DISCONTINUED] amphetamine-dextroamphetamine (ADDERALL) 10 MG tablet Take 1 tablet (10 mg total) by mouth daily as needed.    Allergies: Patient has No Known Allergies. Family History: Patient family history includes Esophageal cancer in his maternal uncle; Healthy in his paternal uncle and son; Hypertension in his mother. Social History:  Patient  reports that he quit smoking about 2 years ago. His smoking use included cigarettes. He has a 45.00 pack-year smoking history. He has never used smokeless tobacco. He reports that he drank alcohol. He reports that he does not use drugs.  Review of Systems: Constitutional: Negative for fever malaise or anorexia Cardiovascular: negative for chest pain Respiratory: negative for SOB or persistent cough Gastrointestinal: negative for abdominal pain Wt Readings from Last 3 Encounters:  06/21/18 228 lb 6.4 oz (103.6 kg)  03/21/18 233 lb 6.4 oz (105.9 kg)  11/24/17 232 lb 6.4 oz (105.4 kg)    Objective  Vitals: BP 132/90   Pulse 93   Temp 98.4 F (36.9 C)   Ht 5' 9"  (1.753 m)   Wt 228 lb 6.4 oz (103.6 kg)   SpO2 96%   BMI 33.73 kg/m  General: no acute distress ,  A&Ox3 HEENT: PEERL, conjunctiva normal, Oropharynx moist,neck is supple Cardiovascular:  RRR without murmur or gallop.  Respiratory:  Good breath sounds bilaterally, CTAB with normal respiratory effort Skin:  Warm, no rashes  No visits with results within 1 Day(s) from this visit.  Latest known visit with results is:  Office Visit on 03/21/2018  Component Date Value Ref Range Status  . Total Bilirubin 03/21/2018 0.5  0.2 - 1.2 mg/dL Final  . Bilirubin, Direct 03/21/2018 0.1  0.0 - 0.3 mg/dL Final  . Alkaline Phosphatase 03/21/2018 74  39 - 117 U/L Final  . AST 03/21/2018 83* 0 - 37 U/L Final  . ALT 03/21/2018 96* 0 - 53 U/L Final  . Total Protein 03/21/2018 8.5* 6.0 - 8.3 g/dL Final  . Albumin 03/21/2018 4.6  3.5 - 5.2 g/dL Final  . Hepatitis C Ab 03/21/2018 NON-REACTIVE  NON-REACTI Final  . SIGNAL TO CUT-OFF 03/21/2018 0.03  <1.00 Final  . Hepatitis B Surface Ag 03/21/2018 NON-REACTIVE  NON-REACTI Final  . Hepatitis  B-Post 03/21/2018 <5* > OR = 10 mIU/mL Final  . Hemoglobin A1C 03/21/2018 5.9* 4.0 - 5.6 % Final     Commons side effects, risks, benefits, and alternatives for medications and treatment plan prescribed today were discussed, and the patient expressed understanding of the given instructions. Patient is instructed to call or message via MyChart if he/she has any questions or concerns regarding our treatment plan. No barriers to understanding were identified. We discussed Red Flag symptoms and signs in detail. Patient expressed understanding regarding what to do in case of urgent or emergency type symptoms.   Medication list was reconciled, printed and provided to the patient in AVS. Patient instructions and summary information was reviewed with the patient as documented in the AVS. This note was prepared with assistance of Dragon voice recognition software. Occasional wrong-word or sound-a-like substitutions may have occurred due to the inherent limitations of voice recognition  software

## 2018-06-26 ENCOUNTER — Ambulatory Visit: Payer: BLUE CROSS/BLUE SHIELD | Admitting: Family Medicine

## 2018-07-19 ENCOUNTER — Encounter: Payer: Self-pay | Admitting: Family Medicine

## 2018-07-19 DIAGNOSIS — Z3009 Encounter for other general counseling and advice on contraception: Secondary | ICD-10-CM

## 2018-07-24 ENCOUNTER — Ambulatory Visit (INDEPENDENT_AMBULATORY_CARE_PROVIDER_SITE_OTHER): Payer: BLUE CROSS/BLUE SHIELD | Admitting: Emergency Medicine

## 2018-07-24 DIAGNOSIS — Z23 Encounter for immunization: Secondary | ICD-10-CM | POA: Diagnosis not present

## 2018-07-24 NOTE — Progress Notes (Signed)
After obtaining consent, and per orders of Dr. Jonni Sanger, injection of Hepatitis B given by Philis Pique Peterman,Patient tolerated injection well.   Doloris Hall,  LPN

## 2018-07-25 ENCOUNTER — Ambulatory Visit: Payer: BLUE CROSS/BLUE SHIELD

## 2018-09-19 ENCOUNTER — Encounter: Payer: Self-pay | Admitting: Family Medicine

## 2018-09-19 ENCOUNTER — Ambulatory Visit (INDEPENDENT_AMBULATORY_CARE_PROVIDER_SITE_OTHER): Payer: BLUE CROSS/BLUE SHIELD | Admitting: Family Medicine

## 2018-09-19 ENCOUNTER — Other Ambulatory Visit: Payer: Self-pay

## 2018-09-19 VITALS — BP 130/84 | HR 98 | Temp 98.3°F | Resp 16 | Ht 69.0 in | Wt 232.0 lb

## 2018-09-19 DIAGNOSIS — E6609 Other obesity due to excess calories: Secondary | ICD-10-CM

## 2018-09-19 DIAGNOSIS — K700 Alcoholic fatty liver: Secondary | ICD-10-CM

## 2018-09-19 DIAGNOSIS — Z0000 Encounter for general adult medical examination without abnormal findings: Secondary | ICD-10-CM | POA: Diagnosis not present

## 2018-09-19 DIAGNOSIS — F988 Other specified behavioral and emotional disorders with onset usually occurring in childhood and adolescence: Secondary | ICD-10-CM

## 2018-09-19 DIAGNOSIS — R7301 Impaired fasting glucose: Secondary | ICD-10-CM

## 2018-09-19 DIAGNOSIS — I10 Essential (primary) hypertension: Secondary | ICD-10-CM

## 2018-09-19 DIAGNOSIS — Z6834 Body mass index (BMI) 34.0-34.9, adult: Secondary | ICD-10-CM

## 2018-09-19 LAB — CBC WITH DIFFERENTIAL/PLATELET
BASOS ABS: 0.1 10*3/uL (ref 0.0–0.1)
BASOS PCT: 0.7 % (ref 0.0–3.0)
Eosinophils Absolute: 0.1 10*3/uL (ref 0.0–0.7)
Eosinophils Relative: 1.9 % (ref 0.0–5.0)
HEMATOCRIT: 48.6 % (ref 39.0–52.0)
Hemoglobin: 17.1 g/dL — ABNORMAL HIGH (ref 13.0–17.0)
LYMPHS PCT: 24.2 % (ref 12.0–46.0)
Lymphs Abs: 1.8 10*3/uL (ref 0.7–4.0)
MCHC: 35.2 g/dL (ref 30.0–36.0)
MCV: 90.6 fl (ref 78.0–100.0)
MONOS PCT: 12.8 % — AB (ref 3.0–12.0)
Monocytes Absolute: 0.9 10*3/uL (ref 0.1–1.0)
NEUTROS ABS: 4.5 10*3/uL (ref 1.4–7.7)
Neutrophils Relative %: 60.4 % (ref 43.0–77.0)
PLATELETS: 247 10*3/uL (ref 150.0–400.0)
RBC: 5.36 Mil/uL (ref 4.22–5.81)
RDW: 13.3 % (ref 11.5–15.5)
WBC: 7.4 10*3/uL (ref 4.0–10.5)

## 2018-09-19 LAB — COMPREHENSIVE METABOLIC PANEL
ALT: 96 U/L — ABNORMAL HIGH (ref 0–53)
AST: 115 U/L — ABNORMAL HIGH (ref 0–37)
Albumin: 4.6 g/dL (ref 3.5–5.2)
Alkaline Phosphatase: 67 U/L (ref 39–117)
BUN: 10 mg/dL (ref 6–23)
CO2: 29 meq/L (ref 19–32)
Calcium: 9.7 mg/dL (ref 8.4–10.5)
Chloride: 99 mEq/L (ref 96–112)
Creatinine, Ser: 0.69 mg/dL (ref 0.40–1.50)
GFR: 123.5 mL/min (ref >60.00)
GLUCOSE: 113 mg/dL — AB (ref 70–99)
Potassium: 4.2 mEq/L (ref 3.5–5.1)
SODIUM: 137 meq/L (ref 135–145)
Total Bilirubin: 0.8 mg/dL (ref 0.2–1.2)
Total Protein: 8 g/dL (ref 6.0–8.3)

## 2018-09-19 LAB — LIPID PANEL
Cholesterol: 146 mg/dL (ref 0–200)
HDL: 54.7 mg/dL (ref >39.00)
LDL CALC: 64 mg/dL (ref 0–99)
NonHDL: 90.85
Total CHOL/HDL Ratio: 3
Triglycerides: 135 mg/dL (ref 0.0–149.0)
VLDL: 27 mg/dL (ref 0.0–40.0)

## 2018-09-19 LAB — POCT GLYCOSYLATED HEMOGLOBIN (HGB A1C): Hemoglobin A1C: 5.7 % — AB (ref 4.0–5.6)

## 2018-09-19 MED ORDER — AMPHETAMINE-DEXTROAMPHETAMINE 10 MG PO TABS: 10.0000 mg | ORAL_TABLET | Freq: Every day | ORAL | 0 refills | Status: DC | PRN

## 2018-09-19 MED ORDER — AMPHETAMINE-DEXTROAMPHET ER 30 MG PO CP24: 30.0000 mg | ORAL_CAPSULE | Freq: Every day | ORAL | 0 refills | Status: DC

## 2018-09-19 MED ORDER — LISINOPRIL-HYDROCHLOROTHIAZIDE 20-12.5 MG PO TABS: 2.0000 | ORAL_TABLET | Freq: Every day | ORAL | 3 refills | Status: DC

## 2018-09-19 NOTE — Progress Notes (Signed)
Subjective  Chief Complaint  Patient presents with  . Annual Exam    Fasting  . ADD    States that adderall is working well    HPI: George Campbell is a 48 y.o. male who presents to [place] at [facility] today for a Male Wellness Visit. He also has the concerns and/or needs as listed above in the chief complaint. These will be addressed in addition to the Health Maintenance Visit.   Wellness Visit: annual visit with health maintenance review and exam   Health maintenance: Patient is eating healthier.  His weight is down 5 pounds since last visit.  Working on eating more vegetables, less processed food.  Feeling well. Immunizations: He will be due for his third hepatitis B vaccination at next visit.  Other immunizations are up-to-date. He is fasting for lab work today. Lifestyle: Body mass index is 34.26 kg/m. Wt Readings from Last 3 Encounters:  09/19/18 232 lb (105.2 kg)  06/21/18 228 lb 6.4 oz (103.6 kg)  03/21/18 233 lb 6.4 oz (105.9 kg)    Chronic disease management visit and/or acute problem visit:  ADD follow-up: Continues to do very well on his Adderall.  Dosing is good.  No adverse effects.  Continues to help with focus, attention, and task completion.  Due today for urine drug screen and updating his annual controlled substance agreement.    History of fatty liver: Recheck levels today.  Alcoholism in remission: He continues to work hard on sobriety.  Had mild relapse over holidays but is in control again.  Denies right upper quadrant pain, hematemesis, abdominal swelling or lower extremity edema.  He reports a normal mood.  Impaired fasting glucose, recheck A1c and fasting glucose levels today.  Hopefully will be improved with improved diet and weight loss.  Hypertension: Feeling well. Taking medications w/o adverse effects. No symptoms of CHF, angina; no palpitations, sob, cp or lower extremity edema. Compliant with meds.   Birth control: He has an  appointment for vasectomy in a few weeks. Patient Active Problem List   Diagnosis Date Noted  . Alcoholism in recovery ([place]) 11/24/2017  . Fatty liver, alcoholic 09/73/5329  . IFG (impaired fasting glucose) 03/25/2015  . Class 1 obesity due to excess calories with serious comorbidity and body mass index (BMI) of 34.0 to 34.9 in adult 08/02/2013  . Essential hypertension with goal blood pressure less than 140/90 08/02/2013  . Attention deficit disorder (ADD) without hyperactivity 04/17/2013   Health Maintenance  Topic Date Due  . TETANUS/TDAP  09/06/2024  . INFLUENZA VACCINE  Completed  . HIV Screening  Completed   Immunization History  Administered Date(s) Administered  . Hepatitis B, adult 06/21/2018, 07/24/2018  . Influenza Inj Mdck Quad Pf 06/10/2017  . Influenza Nasal 05/15/2013, 05/27/2014  . Influenza, Quadrivalent, Recombinant, Inj, Pf 06/10/2017  . Influenza, Seasonal, Injecte, Preservative Fre 06/02/2015, 05/31/2016  . Influenza,inj,Quad PF,6+ Mos 06/21/2018  . Influenza-Unspecified 06/21/2018  . Pneumococcal Polysaccharide-23 09/14/2016  . Tdap 09/06/2014   We updated and reviewed the patient's past history in detail and it is documented below. Allergies: Patient has No Known Allergies. Past Medical History  has a past medical history of Anxiety, Depression, and Hypertension. Past Surgical History Patient  has a past surgical history that includes Knee arthrodesis (Right, 2002) and Mouth surgery. Social History Patient  reports that he quit smoking about 2 years ago. His smoking use included cigarettes. He has a 45.00 pack-year smoking history. He has never used smokeless tobacco.  He reports previous alcohol use. He reports that he does not use drugs. Family History family history includes Esophageal cancer in his maternal uncle; Healthy in his paternal uncle and son; Hypertension in his mother. Review of Systems: Constitutional: negative for fever or  malaise Ophthalmic: negative for photophobia, double vision or loss of vision Cardiovascular: negative for chest pain, dyspnea on exertion, or new LE swelling Respiratory: negative for SOB or persistent cough Gastrointestinal: negative for abdominal pain, change in bowel habits or melena Genitourinary: negative for dysuria or gross hematuria Musculoskeletal: negative for new gait disturbance or muscular weakness Integumentary: negative for new or persistent rashes Neurological: negative for TIA or stroke symptoms Psychiatric: negative for SI or delusions Allergic/Immunologic: negative for hives  Patient Care Team    Relationship Specialty Notifications Start End  Leamon Arnt, MD PCP - General Family Medicine  09/26/17    BP Readings from Last 3 Encounters:  09/19/18 130/84  06/21/18 132/90  03/21/18 120/84    Objective  Vitals: BP 130/84   Pulse 98   Temp 98.3 F (36.8 C) (Oral)   Resp 16   Ht 5' 9"  (1.753 m)   Wt 232 lb (105.2 kg)   SpO2 98%   BMI 34.26 kg/m  General:  Well developed, well nourished, no acute distress  Psych:  Alert and orientedx3,normal mood and affect HEENT:  Normocephalic, atraumatic, non-icteric sclera, PERRL, oropharynx is clear without mass or exudate, supple neck without adenopathy, mass or thyromegaly Cardiovascular:  Normal S1, S2, RRR without gallop, rub or murmur, nondisplaced PMI, +2 distal pulses in bilateral upper and lower extremities. Respiratory:  Good breath sounds bilaterally, CTAB with normal respiratory effort Gastrointestinal: normal bowel sounds, soft, non-tender, no noted masses. No HSM MSK: no deformities, contusions. Joints are without erythema or swelling. Spine and CVA region are nontender Skin:  Warm, no rashes or suspicious lesions noted Neurologic:    Mental status is normal. CN 2-11 are normal. Gross motor and sensory exams are normal. Stable gait. No tremor GU: No inguinal hernias or adenopathy are appreciated  bilaterally   Assessment  1. Annual physical exam   2. Attention deficit disorder (ADD) without hyperactivity   3. Fatty liver, alcoholic   4. IFG (impaired fasting glucose)   5. Essential hypertension with goal blood pressure less than 140/90   6. Class 1 obesity due to excess calories with serious comorbidity and body mass index (BMI) of 34.0 to 34.9 in adult      Plan  Male Wellness Visit:  Age appropriate Health Maintenance and Prevention measures were discussed with patient. Included topics are cancer screening recommendations, ways to keep healthy (see AVS) including dietary and exercise recommendations, regular eye and dental care, use of seat belts, and avoidance of moderate alcohol use and tobacco use.   BMI: discussed patient's BMI and encouraged positive lifestyle modifications to help get to or maintain a target BMI.  HM needs and immunizations were addressed and ordered. See below for orders. See HM and immunization section for updates.  Will return for third hep B vaccination at next visit  Routine labs and screening tests ordered including cmp, cbc and lipids where appropriate.  Discussed recommendations regarding Vit D and calcium supplementation (see AVS)  Chronic disease f/u and/or acute problem visit: (deemed necessary to be done in addition to the wellness visit):  ADD is well controlled.  Refilled medications.  Urine drug screen today with controlled substance agreement signed.  See chart.  History of alcoholism in  remission, history of fatty liver and impaired fasting glucose: Counseling done.  Recheck blood work today.  Hypertension is fairly well controlled.  Continue low-sodium diet and working hard on weight loss.  Continue current medications.  Recheck renal function, electrolytes and lipids.  Follow up: Return in about 3 months (around 12/19/2018) for follow up Hypertension, follow up on ADD and 3rd hep B vaccine.   Commons side effects, risks, benefits,  and alternatives for medications and treatment plan prescribed today were discussed, and the patient expressed understanding of the given instructions. Patient is instructed to call or message via MyChart if he/she has any questions or concerns regarding our treatment plan. No barriers to understanding were identified. We discussed Red Flag symptoms and signs in detail. Patient expressed understanding regarding what to do in case of urgent or emergency type symptoms.   Medication list was reconciled, printed and provided to the patient in AVS. Patient instructions and summary information was reviewed with the patient as documented in the AVS. This note was prepared with assistance of Dragon voice recognition software. Occasional wrong-word or sound-a-like substitutions may have occurred due to the inherent limitations of voice recognition software  Orders Placed This Encounter  Procedures  . CBC with Differential/Platelet  . Comprehensive metabolic panel  . Lipid panel  . HIV Antibody (routine testing w rflx)  . Pain Mgmt, Profile 8 w/Conf, U  . POCT glycosylated hemoglobin (Hb A1C)   Meds ordered this encounter  Medications  . DISCONTD: amphetamine-dextroamphetamine (ADDERALL XR) 30 MG 24 hr capsule    Sig: Take 1 capsule (30 mg total) by mouth daily.    Dispense:  30 capsule    Refill:  0  . DISCONTD: amphetamine-dextroamphetamine (ADDERALL) 10 MG tablet    Sig: Take 1 tablet (10 mg total) by mouth daily as needed.    Dispense:  30 tablet    Refill:  0  . lisinopril-hydrochlorothiazide (PRINZIDE,ZESTORETIC) 20-12.5 MG tablet    Sig: Take 2 tablets by mouth daily.    Dispense:  180 tablet    Refill:  3  . DISCONTD: amphetamine-dextroamphetamine (ADDERALL XR) 30 MG 24 hr capsule    Sig: Take 1 capsule (30 mg total) by mouth daily.    Dispense:  30 capsule    Refill:  0  . DISCONTD: amphetamine-dextroamphetamine (ADDERALL) 10 MG tablet    Sig: Take 1 tablet (10 mg total) by mouth  daily as needed.    Dispense:  30 tablet    Refill:  0  . amphetamine-dextroamphetamine (ADDERALL XR) 30 MG 24 hr capsule    Sig: Take 1 capsule (30 mg total) by mouth daily.    Dispense:  30 capsule    Refill:  0  . amphetamine-dextroamphetamine (ADDERALL) 10 MG tablet    Sig: Take 1 tablet (10 mg total) by mouth daily as needed.    Dispense:  30 tablet    Refill:  0

## 2018-09-19 NOTE — Patient Instructions (Addendum)
Please return in 3 months for for follow up of your ADD and 3rd Hepatitis B vaccination.  If you have any questions or concerns, please don't hesitate to send me a message via MyChart or call the office at [phone]. Thank you for visiting with [place] today! It's our pleasure caring for you.  Please do these things to maintain good health!   Exercise at least 30-45 minutes a day,  4-5 days a week.   Eat a low-fat diet with lots of fruits and vegetables, up to 7-9 servings per day.  Drink plenty of water daily. Try to drink 8 8oz glasses per day.  Seatbelts can save your life. Always wear your seatbelt.  Place Smoke Detectors on every level of your home and check batteries every year.  Eye Doctor - have an eye exam every 1-2 years  Safe sex - use condoms to protect yourself from STDs if you could be exposed to these types of infections.  Avoid heavy alcohol use. If you drink, keep it to less than 2 drinks/day and not every day.  [place].  Choose someone you trust that could speak for you if you became unable to speak for yourself.  Depression is common in our stressful world.If you're feeling down or losing interest in things you normally enjoy, please come in for a visit.3 months for

## 2018-09-22 LAB — PAIN MGMT, PROFILE 8 W/CONF, U
6 Acetylmorphine: NEGATIVE ng/mL (ref <10)
AMPHETAMINES: NEGATIVE ng/mL (ref <500)
Alcohol Metabolites: POSITIVE ng/mL — AB (ref <500)
Benzodiazepines: NEGATIVE ng/mL (ref <100)
Buprenorphine, Urine: NEGATIVE ng/mL (ref <5)
Cocaine Metabolite: NEGATIVE ng/mL (ref <150)
Creatinine: 103.3 mg/dL
Ethyl Glucuronide (ETG): 193856 ng/mL — ABNORMAL HIGH (ref <500)
Ethyl Sulfate (ETS): 28741 ng/mL — ABNORMAL HIGH (ref <100)
MARIJUANA METABOLITE: NEGATIVE ng/mL (ref <20)
MDMA: NEGATIVE ng/mL (ref <500)
Opiates: NEGATIVE ng/mL (ref <100)
Oxidant: NEGATIVE ug/mL (ref <200)
Oxycodone: NEGATIVE ng/mL (ref <100)
pH: 6.62 (ref 4.5–9.0)

## 2018-09-22 LAB — HIV ANTIBODY (ROUTINE TESTING W REFLEX): HIV 1&2 Ab, 4th Generation: NONREACTIVE

## 2018-10-04 ENCOUNTER — Encounter: Payer: Self-pay | Admitting: Family Medicine

## 2018-11-20 ENCOUNTER — Encounter: Payer: Self-pay | Admitting: Family Medicine

## 2018-11-21 MED ORDER — AMPHETAMINE-DEXTROAMPHETAMINE 10 MG PO TABS: 10.0000 mg | ORAL_TABLET | Freq: Every day | ORAL | 0 refills | Status: DC | PRN

## 2018-11-24 ENCOUNTER — Ambulatory Visit: Payer: Self-pay

## 2018-11-24 NOTE — Telephone Encounter (Signed)
FYI

## 2018-11-24 NOTE — Telephone Encounter (Signed)
Noted and agree with triage assessment.

## 2018-11-24 NOTE — Telephone Encounter (Signed)
Patient called and says the job he has in [employer] has someone who was diagnosed with COVID-19 and another person is being tested. He says everyone has close contact with each other, share the same computers, share the same break room. He says he received notification today at work the two people last worked on Monday. He says he doesn't have a fever, cough, but feels tired and worn down. I asked about SOB, he says he has a little SOB like when you have a bad cold/allergies, but nothing bad. He says he isolated himself from his family last night. I advised to remain isolated for 3 days after the symptoms subside and 7 days from the last known exposure, he verbalized understanding. Advised care advice and if symptoms do not improve or get worse, especially SOB gets worse to go to the ED, he verbalized understanding.  Reason for Disposition . [1] COVID-19 EXPOSURE (Close Contact) within last 14 days AND [2] NO cough, fever, or breathing difficulty  Answer Assessment - Initial Assessment Questions 1. CONFIRMED CASE: "Who is the person with the confirmed COVID-19 infection that you were exposed to?"     1 person positive and 1 person suspected 2. PLACE of CONTACT: "Where were you when you were exposed to COVID-19  (coronavirus disease 2019)?" (e.g., city, state, country)     [place], [place] 3. TYPE of CONTACT: "How much contact was there?" (e.g., live in same house, work in same office, same school)     Work at same place, share small breakroom, share computers throughout the building 4. DATE of CONTACT: "When did you have contact with a coronavirus patient?" (e.g., days)     11/20/18 last day of contact 5. DURATION of CONTACT: "How long were you in contact with the COVID-19 (coronavirus disease) patient?" (e.g., a few seconds, passed by person, a few minutes, live with the patient)     Throughout the work day, unknown who the person was 6. SYMPTOMS: "Do you have any symptoms?" (e.g., fever, cough,  breathing difficulty)     Feeling tired and worn down, a little SOB like a bad cold/allergies 7. PREGNANCY OR POSTPARTUM: "Is there any chance you are pregnant?" "When was your last menstrual period?" "Did you deliver in the last 2 weeks?"     N/A 8. HIGH RISK: "Do you have any heart or lung problems? Do you have a weakened immune system?" (e.g., CHF, COPD, asthma, HIV positive, chemotherapy, renal failure, diabetes mellitus, sickle cell anemia)     No  Protocols used: CORONAVIRUS (COVID-19) EXPOSURE-A-AH

## 2018-12-13 ENCOUNTER — Encounter: Payer: Self-pay | Admitting: Family Medicine

## 2018-12-18 ENCOUNTER — Ambulatory Visit (INDEPENDENT_AMBULATORY_CARE_PROVIDER_SITE_OTHER): Payer: BLUE CROSS/BLUE SHIELD | Admitting: Family Medicine

## 2018-12-18 ENCOUNTER — Encounter: Payer: Self-pay | Admitting: Family Medicine

## 2018-12-18 ENCOUNTER — Other Ambulatory Visit: Payer: Self-pay

## 2018-12-18 ENCOUNTER — Ambulatory Visit: Payer: BLUE CROSS/BLUE SHIELD | Admitting: Family Medicine

## 2018-12-18 VITALS — Temp 98.6°F | Wt 226.0 lb

## 2018-12-18 DIAGNOSIS — E6609 Other obesity due to excess calories: Secondary | ICD-10-CM

## 2018-12-18 DIAGNOSIS — F988 Other specified behavioral and emotional disorders with onset usually occurring in childhood and adolescence: Secondary | ICD-10-CM | POA: Diagnosis not present

## 2018-12-18 DIAGNOSIS — K700 Alcoholic fatty liver: Secondary | ICD-10-CM | POA: Diagnosis not present

## 2018-12-18 DIAGNOSIS — I10 Essential (primary) hypertension: Secondary | ICD-10-CM | POA: Diagnosis not present

## 2018-12-18 DIAGNOSIS — Z6834 Body mass index (BMI) 34.0-34.9, adult: Secondary | ICD-10-CM

## 2018-12-18 DIAGNOSIS — F1021 Alcohol dependence, in remission: Secondary | ICD-10-CM

## 2018-12-18 DIAGNOSIS — F1020 Alcohol dependence, uncomplicated: Secondary | ICD-10-CM | POA: Diagnosis not present

## 2018-12-18 MED ORDER — AMLODIPINE BESYLATE 10 MG PO TABS: ORAL_TABLET | ORAL | 3 refills | Status: DC

## 2018-12-18 MED ORDER — AMPHETAMINE-DEXTROAMPHET ER 30 MG PO CP24: 30.0000 mg | ORAL_CAPSULE | Freq: Every day | ORAL | 0 refills | Status: DC

## 2018-12-18 MED ORDER — AMPHETAMINE-DEXTROAMPHETAMINE 10 MG PO TABS: 10.0000 mg | ORAL_TABLET | Freq: Every day | ORAL | 0 refills | Status: DC | PRN

## 2018-12-18 NOTE — Progress Notes (Signed)
Virtual Visit via Video Note  Subjective  CC:  Chief Complaint  Patient presents with  . Hypertension  . ADD    Reportd that medication is still working well     I connected with Tera Partridge on 12/18/18 at 11:00 AM EDT by a video enabled telemedicine application and verified that I am speaking with the correct person using two identifiers. Location patient: work Location provider: [employer], Office Persons participating in the virtual visit: Marvion Bastidas, Leamon Arnt, MD Lilli Light, [place]  I discussed the limitations of evaluation and management by telemedicine and the availability of in person appointments. The patient expressed understanding and agreed to proceed. HPI: Jaice Digioia is a 48 y.o. male who was contacted today to address the problems listed above in the chief complaint. . Patient is here today for follow up of ADD/ADHD. He is taking medication as directed and continues to feel it is beneficial. The medications continue to help with focus and attention and task completion. He denies adverse side effects; specifically no headaches, appetite suppression, weight loss, sleeping difficulty, heart palpitations, chest pain or significant weight changes.  This patient does not have contraindications for stimulant use include tachycardia, arrhythmia, psychosis, bipolar disorder, severe anorexia, and Tourette syndrome. He has well controlled hypertension.  [name] HTN: stable. No sxs of chf or cad. Taking meds and due refill of amlodipine. Nl renal function in January. No leg swelling. No AEs.  . Alcoholism: doing very well in recovery now - stay at home recommendations are helpful: not going out for alcohol etc.  . Obesity: weight is stable.  . Elevated lfts: no ruq pain or jaundice.  ROS + allergy sxs: taking an otc oral antihistamine  Wt Readings from Last 3 Encounters:  12/18/18 226 lb (102.5 kg)  09/19/18 232 lb (105.2 kg)  06/21/18 228 lb 6.4 oz  (103.6 kg)   Lab Results  Component Value Date   ALT 96 (H) 09/19/2018   AST 115 (H) 09/19/2018   ALKPHOS 67 09/19/2018   BILITOT 0.8 09/19/2018    Assessment  1. Attention deficit disorder (ADD) without hyperactivity   2. Fatty liver, alcoholic   3. Essential hypertension with goal blood pressure less than 140/90   4. Alcoholism ([place]) Chronic  5. Alcoholism in recovery ([place])   6. Class 1 obesity due to excess calories with serious comorbidity and body mass index (BMI) of 34.0 to 34.9 in adult      Plan   HTN:  This medical condition is well controlled. There are no signs of complications, medication side effects, or red flags. Patient is instructed to continue the current treatment plan without change in therapies or medications.   ADD: doing well on meds. Reviewed PMDAware. No concerns. UDS up to date. Contract up to date. Refilled x 30 days. Pt will send my chart request when next refill due.   Obesity and elevated lfts: avoid alcohol and continue low fat diet. Will recheck next visit to ensure stability.   Alcoholism: counseling done.   Allergies: monitor on antihistamine.  I discussed the assessment and treatment plan with the patient. The patient was provided an opportunity to ask questions and all were answered. The patient agreed with the plan and demonstrated an understanding of the instructions.   The patient was advised to call back or seek an in-person evaluation if the symptoms worsen or if the condition fails to improve as anticipated. Follow up: Return in about 3 months (  around 03/19/2019) for follow up Hypertension, follow up on ADD, lfts.  Visit date not found  Meds ordered this encounter  Medications  . amphetamine-dextroamphetamine (ADDERALL XR) 30 MG 24 hr capsule    Sig: Take 1 capsule (30 mg total) by mouth daily.    Dispense:  30 capsule    Refill:  0  . amphetamine-dextroamphetamine (ADDERALL) 10 MG tablet    Sig: Take 1 tablet (10 mg total) by mouth  daily as needed.    Dispense:  30 tablet    Refill:  0  . amLODipine (NORVASC) 10 MG tablet    Sig: TAKE 1 TABLET(10 MG) BY MOUTH DAILY    Dispense:  90 tablet    Refill:  3      I reviewed the patients updated PMH, FH, and SocHx.    Patient Active Problem List   Diagnosis Date Noted  . Alcoholism in recovery ([place]) 11/24/2017  . Fatty liver, alcoholic 27/25/3664  . IFG (impaired fasting glucose) 03/25/2015  . Class 1 obesity due to excess calories with serious comorbidity and body mass index (BMI) of 34.0 to 34.9 in adult 08/02/2013  . Essential hypertension with goal blood pressure less than 140/90 08/02/2013  . Attention deficit disorder (ADD) without hyperactivity 04/17/2013   Current Meds  Medication Sig  . amLODipine (NORVASC) 10 MG tablet TAKE 1 TABLET(10 MG) BY MOUTH DAILY  . amphetamine-dextroamphetamine (ADDERALL XR) 30 MG 24 hr capsule Take 1 capsule (30 mg total) by mouth daily.  [name] amphetamine-dextroamphetamine (ADDERALL) 10 MG tablet Take 1 tablet (10 mg total) by mouth daily as needed.  [name] lisinopril-hydrochlorothiazide (PRINZIDE,ZESTORETIC) 20-12.5 MG tablet Take 2 tablets by mouth daily.  . [DISCONTINUED] amLODipine (NORVASC) 10 MG tablet TAKE 1 TABLET(10 MG) BY MOUTH DAILY  . [DISCONTINUED] amphetamine-dextroamphetamine (ADDERALL XR) 30 MG 24 hr capsule Take 1 capsule (30 mg total) by mouth daily.  . [DISCONTINUED] amphetamine-dextroamphetamine (ADDERALL) 10 MG tablet Take 1 tablet (10 mg total) by mouth daily as needed.    Allergies: Patient has No Known Allergies. Family History: Patient family history includes Esophageal cancer in his maternal uncle; Healthy in his paternal uncle and son; Hypertension in his mother. Social History:  Patient  reports that he quit smoking about 3 years ago. His smoking use included cigarettes. He has a 45.00 pack-year smoking history. He has never used smokeless tobacco. He reports previous alcohol use. He reports that he does not  use drugs.  Review of Systems: Constitutional: Negative for fever malaise or anorexia Cardiovascular: negative for chest pain Respiratory: negative for SOB or persistent cough Gastrointestinal: negative for abdominal pain  OBJECTIVE Vitals: Temp 98.6 F (37 C) (Oral)   Wt 226 lb (102.5 kg)   BMI 33.37 kg/m  General: no acute distress , A&Ox3  Leamon Arnt, MD

## 2018-12-18 NOTE — Patient Instructions (Addendum)
Please return in 3 months for f/u ADD/HTN, elevated lfts and weight.    If you have any questions or concerns, please don't hesitate to send me a message via MyChart or call the office at [phone]. Thank you for visiting with [place] today! It's our pleasure caring for you.

## 2019-01-05 ENCOUNTER — Encounter: Payer: Self-pay | Admitting: Family Medicine

## 2019-01-14 ENCOUNTER — Other Ambulatory Visit: Payer: Self-pay | Admitting: Family Medicine

## 2019-01-15 ENCOUNTER — Other Ambulatory Visit: Payer: Self-pay | Admitting: Family Medicine

## 2019-01-15 ENCOUNTER — Encounter: Payer: Self-pay | Admitting: Family Medicine

## 2019-01-15 MED ORDER — AMPHETAMINE-DEXTROAMPHET ER 30 MG PO CP24: 30.0000 mg | ORAL_CAPSULE | Freq: Every day | ORAL | 0 refills | Status: DC

## 2019-01-15 MED ORDER — AMPHETAMINE-DEXTROAMPHETAMINE 10 MG PO TABS: 10.0000 mg | ORAL_TABLET | Freq: Every day | ORAL | 0 refills | Status: DC | PRN

## 2019-01-15 NOTE — Telephone Encounter (Signed)
Last appointment 12/18/18 F/u 03/19/19 Ok to refill?

## 2019-01-15 NOTE — Telephone Encounter (Signed)
Last OV: 12/18/18 Next OV: 03/19/19 PMP website checked

## 2019-01-17 ENCOUNTER — Encounter: Payer: Self-pay | Admitting: Family Medicine

## 2019-01-18 ENCOUNTER — Ambulatory Visit: Payer: BLUE CROSS/BLUE SHIELD | Admitting: Family Medicine

## 2019-01-18 NOTE — Telephone Encounter (Signed)
Left message to return call to our office.  

## 2019-01-19 ENCOUNTER — Encounter: Payer: Self-pay | Admitting: Family Medicine

## 2019-01-19 ENCOUNTER — Other Ambulatory Visit: Payer: Self-pay

## 2019-01-19 ENCOUNTER — Ambulatory Visit (INDEPENDENT_AMBULATORY_CARE_PROVIDER_SITE_OTHER): Payer: BLUE CROSS/BLUE SHIELD | Admitting: Family Medicine

## 2019-01-19 ENCOUNTER — Ambulatory Visit: Payer: BLUE CROSS/BLUE SHIELD | Admitting: Family Medicine

## 2019-01-19 DIAGNOSIS — F1020 Alcohol dependence, uncomplicated: Secondary | ICD-10-CM

## 2019-01-19 NOTE — Patient Instructions (Signed)
[facility]: [phone], check their website at [email or web address]  [facility]: [facility]: [phone]

## 2019-01-19 NOTE — Progress Notes (Signed)
Virtual Visit via Video Note  Subjective  CC:  Chief Complaint  Patient presents with  . Alcohol TX    He reports that he drinks about 8-12 beers daily and it is affecting home life and relationships.. Wants to discuss [place] options     I connected with Tera Partridge on 01/19/19 at  1:20 PM EDT by a video enabled telemedicine application and verified that I am speaking with the correct person using two identifiers. Location patient: Home Location provider: [facility] [place], Office Persons participating in the virtual visit: Osmel Dykstra, Leamon Arnt, MD Lilli Light, [place] discussed the limitations of evaluation and management by telemedicine and the availability of in person appointments. The patient expressed understanding and agreed to proceed. HPI: George Campbell is a 48 y.o. male who was contacted today to address the problems listed above in the chief complaint. [name] He is ready for an outpt alcohol recovery program. He is drinking daily; functioning alcoholic; works full time 6am-3pm. Interfering with home life due to quick temper. Physically feels well. No sxs of DT or WD but has been drinking daily for the last month. No h/o DTs. Completed an inpatient rehab program about 15 years ago. Has no alcohol related medical complications.  Assessment  1. Alcoholism ([place])      Plan   alcoholism:  [facility] if qualifies.   I discussed the assessment and treatment plan with the patient. The patient was provided an opportunity to ask questions and all were answered. The patient agreed with the plan and demonstrated an understanding of the instructions.   The patient was advised to call back or seek an in-person evaluation if the symptoms worsen or if the condition fails to improve as anticipated. Follow up: No follow-ups on file.  03/19/2019  No orders of the defined types were placed in this encounter.     I reviewed the  patients updated PMH, FH, and SocHx.    Patient Active Problem List   Diagnosis Date Noted  . Alcoholism in recovery ([place]) 11/24/2017  . Fatty liver, alcoholic 38/46/6599  . IFG (impaired fasting glucose) 03/25/2015  . Class 1 obesity due to excess calories with serious comorbidity and body mass index (BMI) of 34.0 to 34.9 in adult 08/02/2013  . Essential hypertension with goal blood pressure less than 140/90 08/02/2013  . Attention deficit disorder (ADD) without hyperactivity 04/17/2013   Current Meds  Medication Sig  . amLODipine (NORVASC) 10 MG tablet TAKE 1 TABLET(10 MG) BY MOUTH DAILY  . amphetamine-dextroamphetamine (ADDERALL XR) 30 MG 24 hr capsule Take 1 capsule (30 mg total) by mouth daily.  [name] amphetamine-dextroamphetamine (ADDERALL) 10 MG tablet Take 1 tablet (10 mg total) by mouth daily as needed.  [name] lisinopril-hydrochlorothiazide (PRINZIDE,ZESTORETIC) 20-12.5 MG tablet Take 2 tablets by mouth daily.    Allergies: Patient has No Known Allergies. Family History: Patient family history includes Esophageal cancer in his maternal uncle; Healthy in his paternal uncle and son; Hypertension in his mother. Social History:  Patient  reports that he quit smoking about 3 years ago. His smoking use included cigarettes. He has a 45.00 pack-year smoking history. He has never used smokeless tobacco. He reports previous alcohol use. He reports that he does not use drugs.  Review of Systems: Constitutional: Negative for fever malaise or anorexia Cardiovascular: negative for chest pain Respiratory: negative for SOB or persistent cough Gastrointestinal: negative for abdominal pain  OBJECTIVE Vitals: There were no vitals taken for this visit. General: no acute distress , A&Ox3, appears well. Normal speech  Leamon Arnt, MD

## 2019-02-05 ENCOUNTER — Encounter: Payer: Self-pay | Admitting: Family Medicine

## 2019-02-13 ENCOUNTER — Other Ambulatory Visit: Payer: Self-pay | Admitting: Family Medicine

## 2019-02-13 ENCOUNTER — Encounter: Payer: Self-pay | Admitting: Family Medicine

## 2019-02-13 MED ORDER — AMPHETAMINE-DEXTROAMPHETAMINE 10 MG PO TABS: 10.0000 mg | ORAL_TABLET | Freq: Every day | ORAL | 0 refills | Status: DC | PRN

## 2019-02-13 MED ORDER — AMPHETAMINE-DEXTROAMPHET ER 30 MG PO CP24: 30.0000 mg | ORAL_CAPSULE | Freq: Every day | ORAL | 0 refills | Status: DC

## 2019-02-14 ENCOUNTER — Encounter: Payer: Self-pay | Admitting: Family Medicine

## 2019-02-14 MED ORDER — AMPHETAMINE-DEXTROAMPHETAMINE 10 MG PO TABS: 10.0000 mg | ORAL_TABLET | Freq: Every day | ORAL | 0 refills | Status: DC | PRN

## 2019-03-15 ENCOUNTER — Encounter: Payer: Self-pay | Admitting: Family Medicine

## 2019-03-19 ENCOUNTER — Ambulatory Visit (INDEPENDENT_AMBULATORY_CARE_PROVIDER_SITE_OTHER): Payer: BC Managed Care – PPO | Admitting: Family Medicine

## 2019-03-19 ENCOUNTER — Ambulatory Visit: Payer: BLUE CROSS/BLUE SHIELD | Admitting: Family Medicine

## 2019-03-19 ENCOUNTER — Encounter: Payer: Self-pay | Admitting: Family Medicine

## 2019-03-19 VITALS — BP 110/75

## 2019-03-19 DIAGNOSIS — F988 Other specified behavioral and emotional disorders with onset usually occurring in childhood and adolescence: Secondary | ICD-10-CM | POA: Diagnosis not present

## 2019-03-19 DIAGNOSIS — I10 Essential (primary) hypertension: Secondary | ICD-10-CM

## 2019-03-19 DIAGNOSIS — F1021 Alcohol dependence, in remission: Secondary | ICD-10-CM | POA: Diagnosis not present

## 2019-03-19 MED ORDER — AMPHETAMINE-DEXTROAMPHET ER 30 MG PO CP24: 30.0000 mg | ORAL_CAPSULE | Freq: Every day | ORAL | 0 refills | Status: DC

## 2019-03-19 MED ORDER — AMPHETAMINE-DEXTROAMPHETAMINE 10 MG PO TABS: 10.0000 mg | ORAL_TABLET | Freq: Every day | ORAL | 0 refills | Status: DC | PRN

## 2019-03-19 NOTE — Progress Notes (Signed)
Virtual Visit via Video Note  Subjective  CC:  Chief Complaint  Patient presents with  . Hypertension    He checks BP occassionally with readings averaging 110s/mid 70s & 80s  . ADD    Adderall doing well     I connected with Tera Partridge on 03/19/19 at  2:40 PM EDT by a video enabled telemedicine application and verified that I am speaking with the correct person using two identifiers. Location patient: Home Location provider: [facility] [place], Office Persons participating in the virtual visit: George Campbell, George Arnt, MD Lilli Light, [place] discussed the limitations of evaluation and management by telemedicine and the availability of in person appointments. The patient expressed understanding and agreed to proceed. HPI: George Campbell is a 48 y.o. male who was contacted today to address the problems listed above in the chief complaint. . Patient is here today for follow up of ADD/ADHD. He is taking medication as directed and continues to feel it is beneficial. The medications continue to help with focus and attention and task completion. He denies adverse side effects; specifically no headaches, appetite suppression, weight loss, sleeping difficulty, heart palpitations, chest pain or significant weight changes.  This patient does not have contraindications for stimulant use include hypertension, tachycardia, arrhythmia, psychosis, bipolar disorder, severe anorexia, and Tourette syndrome.  Hypertension f/u: Control is good . Pt reports he is doing well. taking medications as instructed, no medication side effects noted, no TIAs, no chest pain on exertion, no dyspnea on exertion, no swelling of ankles. He denies adverse effects from his BP medications. Compliance with medication is good.   BP Readings from Last 3 Encounters:  03/19/19 110/75  09/19/18 130/84  06/21/18 132/90   Wt Readings from Last 3 Encounters:  12/18/18 226 lb (102.5 kg)   09/19/18 232 lb (105.2 kg)  06/21/18 228 lb 6.4 oz (103.6 kg)    Lab Results  Component Value Date   CHOL 146 09/19/2018   CHOL 140 09/26/2017   CHOL 134 09/14/2016   Lab Results  Component Value Date   HDL 54.70 09/19/2018   HDL 54.90 09/26/2017   HDL 52 09/14/2016   Lab Results  Component Value Date   LDLCALC 64 09/19/2018   LDLCALC 56 09/26/2017   LDLCALC 53 09/14/2016   Lab Results  Component Value Date   TRIG 135.0 09/19/2018   TRIG 147.0 09/26/2017   TRIG 147 09/14/2016   Lab Results  Component Value Date   CHOLHDL 3 09/19/2018   CHOLHDL 3 09/26/2017   No results found for: LDLDIRECT Lab Results  Component Value Date   CREATININE 0.69 09/19/2018   BUN 10 09/19/2018   NA 137 09/19/2018   K 4.2 09/19/2018   CL 99 09/19/2018   CO2 29 09/19/2018    The 10-year ASCVD risk score [name], et al., 2013) is: 1.1%   Values used to calculate the score:     Age: 22 years     Sex: Male     Is Non-Hispanic African American: No     Diabetic: No     Tobacco smoker: No     Systolic Blood Pressure: 836 mmHg     Is BP treated: Yes     HDL Cholesterol: 54.7 mg/dL     Total Cholesterol: 146 mg/dL  Alcoholism: sober now x 2 months. "I dived in to AA" - working hard on changing thought processes and behaviors; did not  [facility]. Has a sponsor. Home life is better. Had elevated lfts at last visit - will recheck in future, hopefully he will remain in recovery.  Assessment  1. Attention deficit disorder (ADD) without hyperactivity   2. Alcoholism in recovery ([place])   3. Essential hypertension with goal blood pressure less than 140/90      Plan   ADD:  Very stable. Refilled meds x 30 days; then will recheck monthly via mychart requests.   Alcoholism: AA and recommend starting with an addiction specialist counselor as well to help with process of CBT/change. He will consider it  HTN: well controlled on meds.  I discussed the assessment and treatment  plan with the patient. The patient was provided an opportunity to ask questions and all were answered. The patient agreed with the plan and demonstrated an understanding of the instructions.   The patient was advised to call back or seek an in-person evaluation if the symptoms worsen or if the condition fails to improve as anticipated. Follow up: Return in about 6 months (around 09/19/2019) for complete physical, follow up on ADD.  Visit date not found  Meds ordered this encounter  Medications  . amphetamine-dextroamphetamine (ADDERALL XR) 30 MG 24 hr capsule    Sig: Take 1 capsule (30 mg total) by mouth daily.    Dispense:  30 capsule    Refill:  0  . amphetamine-dextroamphetamine (ADDERALL) 10 MG tablet    Sig: Take 1 tablet (10 mg total) by mouth daily as needed.    Dispense:  30 tablet    Refill:  0      I reviewed the patients updated PMH, FH, and SocHx.    Patient Active Problem List   Diagnosis Date Noted  . Alcoholism in recovery ([place]) 11/24/2017  . Fatty liver, alcoholic 16/05/9603  . IFG (impaired fasting glucose) 03/25/2015  . Class 1 obesity due to excess calories with serious comorbidity and body mass index (BMI) of 34.0 to 34.9 in adult 08/02/2013  . Essential hypertension with goal blood pressure less than 140/90 08/02/2013  . Attention deficit disorder (ADD) without hyperactivity 04/17/2013   Current Meds  Medication Sig  . amLODipine (NORVASC) 10 MG tablet TAKE 1 TABLET(10 MG) BY MOUTH DAILY  . amphetamine-dextroamphetamine (ADDERALL XR) 30 MG 24 hr capsule Take 1 capsule (30 mg total) by mouth daily.  [name] amphetamine-dextroamphetamine (ADDERALL) 10 MG tablet Take 1 tablet (10 mg total) by mouth daily as needed.  [name] lisinopril-hydrochlorothiazide (PRINZIDE,ZESTORETIC) 20-12.5 MG tablet Take 2 tablets by mouth daily.  . [DISCONTINUED] amphetamine-dextroamphetamine (ADDERALL XR) 30 MG 24 hr capsule Take 1 capsule (30 mg total) by mouth daily.  . [DISCONTINUED]  amphetamine-dextroamphetamine (ADDERALL) 10 MG tablet Take 1 tablet (10 mg total) by mouth daily as needed.    Allergies: Patient has No Known Allergies. Family History: Patient family history includes Esophageal cancer in his maternal uncle; Healthy in his paternal uncle and son; Hypertension in his mother. Social History:  Patient  reports that he quit smoking about 3 years ago. His smoking use included cigarettes. He has a 45.00 pack-year smoking history. He has never used smokeless tobacco. He reports previous alcohol use. He reports that he does not use drugs.  Review of Systems: Constitutional: Negative for fever malaise or anorexia Cardiovascular: negative for chest pain Respiratory: negative for SOB or persistent cough Gastrointestinal: negative for abdominal pain  OBJECTIVE Vitals: BP 110/75  General: no acute distress , A&Ox3  George Arnt, MD

## 2019-04-11 ENCOUNTER — Ambulatory Visit: Payer: Self-pay | Admitting: *Deleted

## 2019-04-11 NOTE — Telephone Encounter (Signed)
See below

## 2019-04-11 NOTE — Telephone Encounter (Signed)
Wife is calling with concerns- husband started drinking last night and he got so bad that she had to take the children and leave. She is calling back this morning to ask what she should do - she is not with him- but has spoken to him on the phone. Patient is upset that they are not at the home and states he wants to go to rehab/detox program. She is going to go to the home to check on him and will call NT back for information.( She is 3 hours away- she went to her parent's home) Tried to call [facility] and line keeps ringing busy.Will send note for FYI and maybe they can make note if they have preference for treatment referral for when wife calls back.  Reason for Disposition . Alcohol or drug abuse, known or suspected  Protocols used: SUBSTANCE ABUSE AND DEPENDENCE-A-AH

## 2019-04-11 NOTE — Telephone Encounter (Signed)
Ok for [facility] to Discuss results / PCP recommendations.

## 2019-04-11 NOTE — Telephone Encounter (Signed)
Noted.  I recommend telling her to do what is needed to keep herself and her children safe.  [place] can go directly to AD[facility] or ER if he needs detox; he can get himself in with rehab program. He has researched this recently.   Let me know if she other questions.

## 2019-04-11 NOTE — Telephone Encounter (Signed)
See note

## 2019-05-13 ENCOUNTER — Other Ambulatory Visit: Payer: Self-pay | Admitting: Family Medicine

## 2019-05-14 ENCOUNTER — Encounter: Payer: Self-pay | Admitting: Family Medicine

## 2019-05-14 MED ORDER — AMPHETAMINE-DEXTROAMPHETAMINE 10 MG PO TABS: 10.0000 mg | ORAL_TABLET | Freq: Every day | ORAL | 0 refills | Status: DC | PRN

## 2019-05-14 MED ORDER — AMPHETAMINE-DEXTROAMPHET ER 30 MG PO CP24: 30.0000 mg | ORAL_CAPSULE | Freq: Every day | ORAL | 0 refills | Status: DC

## 2019-06-04 ENCOUNTER — Encounter: Payer: Self-pay | Admitting: Family Medicine

## 2019-06-06 ENCOUNTER — Encounter: Payer: Self-pay | Admitting: Family Medicine

## 2019-06-06 ENCOUNTER — Ambulatory Visit (INDEPENDENT_AMBULATORY_CARE_PROVIDER_SITE_OTHER): Payer: BC Managed Care – PPO | Admitting: Family Medicine

## 2019-06-06 DIAGNOSIS — F988 Other specified behavioral and emotional disorders with onset usually occurring in childhood and adolescence: Secondary | ICD-10-CM

## 2019-06-06 DIAGNOSIS — I10 Essential (primary) hypertension: Secondary | ICD-10-CM | POA: Diagnosis not present

## 2019-06-06 DIAGNOSIS — F1021 Alcohol dependence, in remission: Secondary | ICD-10-CM | POA: Diagnosis not present

## 2019-06-06 MED ORDER — TRAZODONE HCL 50 MG PO TABS: 50.0000 mg | ORAL_TABLET | Freq: Every day | ORAL | 2 refills | Status: DC

## 2019-06-06 NOTE — Progress Notes (Signed)
Virtual Visit via Video Note  Subjective  CC:  Chief Complaint  Patient presents with  . Insomnia    Was started on Trazodone 16m reports helps with sleep and needing refills..Marland KitchenHe completed alcohol TX on 05/10/19     I connected with WTera Campbell 06/06/19 at  3:40 PM EDT by a video enabled telemedicine application and verified that I am speaking with the correct person using two identifiers. Location patient: [place] [facility] [place] Persons participating in the virtual visit: George Campbell George Arnt MD TLilli Light CFarmingtondiscussed the limitations of evaluation and management by telemedicine and the availability of in person appointments. The patient expressed understanding and agreed to proceed. HPI: WRasean Campbell a 48y.o. male who was contacted today to address the problems listed above in the chief complaint. . Now almost 60 days sober; [facility] and now doing intensive outpt therapy 3x/week and regular AA meetings. Taking weekly naltrexone injections as well. FEELS GREAT! Is motivated again and hopeful. Needed sleep meds while in  Inpatient therapy. Continues. Sleeps well and through the night: hasn't had normal sleep patterns due to alcoholism. Also on adderall. No AEs. Feels he won't need it long term but is not yet ready to wean.  .Marland KitchenHTN is well controlled now since sober. Nl home readings on meds.  . ADD is controlled. . Had flu shot this weekend.   Assessment  1. Alcoholism in recovery ([place]   2. Essential hypertension with goal blood pressure less than 140/90   3. Attention deficit disorder (ADD) without hyperactivity      Plan   Alcoholism in recovery:  Praised. i'm happy for him and his family. Continue to work on recovery. Refilled meds. Will try to slowly lower dose. Educated about weaning in future if able. Discussed possibility of adderall also affecting  sleep.   HTN and ADD are controlled. Continue same meds.  I discussed the assessment and treatment plan with the patient. The patient was provided an opportunity to ask questions and all were answered. The patient agreed with the plan and demonstrated an understanding of the instructions.   The patient was advised to call back or seek an in-person evaluation if the symptoms worsen or if the condition fails to improve as anticipated. Follow up: cpe in Jan 2021  Visit date not found  Meds ordered this encounter  Medications  . traZODone (DESYREL) 50 MG tablet    Sig: Take 1-3 tablets (50-150 mg total) by mouth at bedtime.    Dispense:  90 tablet    Refill:  2      I reviewed the patients updated PMH, FH, and SocHx.    Patient Active Problem List   Diagnosis Date Noted  . [place] 11/24/2017  . Fatty liver, alcoholic 088/41/6606 . IFG (impaired fasting glucose) 03/25/2015  . Class 1 obesity due to excess calories with serious comorbidity and body mass index (BMI) of 34.0 to 34.9 in adult 08/02/2013  . Essential hypertension with goal blood pressure less than 140/90 08/02/2013  . Attention deficit disorder (ADD) without hyperactivity 04/17/2013   Current Meds  Medication Sig  . amLODipine (NORVASC) 10 MG tablet TAKE 1 TABLET(10 MG) BY MOUTH DAILY  . amphetamine-dextroamphetamine (ADDERALL XR) 30 MG 24 hr capsule Take 1 capsule (30 mg total) by mouth daily.  .Marland Kitchenamphetamine-dextroamphetamine (ADDERALL) 10  MG tablet Take 1 tablet (10 mg total) by mouth daily as needed.  [name] lisinopril-hydrochlorothiazide (PRINZIDE,ZESTORETIC) 20-12.5 MG tablet Take 2 tablets by mouth daily.  . [DISCONTINUED] traZODone (DESYREL) 150 MG tablet TK 1 T PO QHS PRN    Allergies: Patient is allergic to strattera [atomoxetine hcl]. Family History: Patient family history includes Esophageal cancer in his maternal uncle; Healthy in his paternal uncle and son; Hypertension in his mother.  Social History:  Patient  reports that he quit smoking about 3 years ago. His smoking use included cigarettes. He has a 45.00 pack-year smoking history. He has never used smokeless tobacco. He reports previous alcohol use. He reports that he does not use drugs.  Review of Systems: Constitutional: Negative for fever malaise or anorexia Cardiovascular: negative for chest pain Respiratory: negative for SOB or persistent cough Gastrointestinal: negative for abdominal pain  OBJECTIVE Vitals: There were no vitals taken for this visit. General: no acute distress , A&Ox3 Looks great, smiling George Arnt, MD

## 2019-06-11 ENCOUNTER — Other Ambulatory Visit: Payer: Self-pay | Admitting: Family Medicine

## 2019-06-11 MED ORDER — AMPHETAMINE-DEXTROAMPHET ER 30 MG PO CP24: 30.0000 mg | ORAL_CAPSULE | Freq: Every day | ORAL | 0 refills | Status: DC

## 2019-06-11 MED ORDER — AMPHETAMINE-DEXTROAMPHETAMINE 10 MG PO TABS: 10.0000 mg | ORAL_TABLET | Freq: Every day | ORAL | 0 refills | Status: DC | PRN

## 2019-06-11 NOTE — Telephone Encounter (Signed)
Last OV: 06/06/19 Next OV: None scheduled at this time PMP website checked: last filled on 05/14/19

## 2019-07-10 ENCOUNTER — Other Ambulatory Visit: Payer: Self-pay | Admitting: Family Medicine

## 2019-07-10 MED ORDER — AMPHETAMINE-DEXTROAMPHETAMINE 10 MG PO TABS: 10.0000 mg | ORAL_TABLET | Freq: Every day | ORAL | 0 refills | Status: DC | PRN

## 2019-07-10 MED ORDER — AMPHETAMINE-DEXTROAMPHET ER 30 MG PO CP24: 30.0000 mg | ORAL_CAPSULE | Freq: Every day | ORAL | 0 refills | Status: DC

## 2019-07-10 NOTE — Telephone Encounter (Signed)
Pt requesting refill on Adderall XR 30 mg and Adderall 10 mg. Last OV 06/06/19.

## 2019-08-07 ENCOUNTER — Other Ambulatory Visit: Payer: Self-pay | Admitting: Family Medicine

## 2019-08-07 MED ORDER — AMPHETAMINE-DEXTROAMPHETAMINE 10 MG PO TABS: 10.0000 mg | ORAL_TABLET | Freq: Every day | ORAL | 0 refills | Status: DC | PRN

## 2019-08-07 MED ORDER — AMPHETAMINE-DEXTROAMPHET ER 30 MG PO CP24: 30.0000 mg | ORAL_CAPSULE | Freq: Every day | ORAL | 0 refills | Status: DC

## 2019-08-07 NOTE — Telephone Encounter (Signed)
Pt requesting refills Last OV 05/2019.

## 2019-08-07 NOTE — Telephone Encounter (Signed)
Will not let me refuse frilled today already

## 2019-08-13 ENCOUNTER — Other Ambulatory Visit: Payer: Self-pay

## 2019-08-13 ENCOUNTER — Encounter: Payer: Self-pay | Admitting: Family Medicine

## 2019-08-14 ENCOUNTER — Encounter: Payer: Self-pay | Admitting: Family Medicine

## 2019-08-14 ENCOUNTER — Ambulatory Visit (INDEPENDENT_AMBULATORY_CARE_PROVIDER_SITE_OTHER): Payer: BC Managed Care – PPO | Admitting: Family Medicine

## 2019-08-14 VITALS — BP 142/84 | HR 94 | Temp 97.2°F | Ht 69.0 in | Wt 241.0 lb

## 2019-08-14 DIAGNOSIS — K4090 Unilateral inguinal hernia, without obstruction or gangrene, not specified as recurrent: Secondary | ICD-10-CM

## 2019-08-14 MED ORDER — KETOROLAC TROMETHAMINE 60 MG/2ML IM SOLN
60.0000 mg | Freq: Once | INTRAMUSCULAR | Status: AC
  Administered 2019-08-14: 15:00:00 60 mg via INTRAMUSCULAR

## 2019-08-14 MED ORDER — DICLOFENAC SODIUM 75 MG PO TBEC: 75.0000 mg | DELAYED_RELEASE_TABLET | Freq: Two times a day (BID) | ORAL | 0 refills | Status: DC

## 2019-08-14 NOTE — Progress Notes (Signed)
Subjective  CC:  Chief Complaint  Patient presents with  . Muscle Pain    2 weeks on the right side/some improvement    HPI: George Campbell is a 48 y.o. male who presents to the office today to address the problems listed above in the chief complaint.  48 yo male who works at [employer], heavy lifting but no injury, reports awoke several days ago with right groin/inner upper thigh pain. Moderate to severe pain requiring being out of work for 2 days due to pain with moving. Took advil with some relief. Did ok for a few days but then pain has returned. Feels "full" in groin. Pain with lying back or walking. No bruising or bulging noted. Normal GU function. No GI sxs.   Assessment  1. Right inguinal hernia      Plan   Right inguinal hernia:  Painful: but not incarcerated on exam. Urgent referral to surgery for evaluation. Pt given toradol in office with some relief. Use nsaids as needed. To ER for severe pain. May return to work but no lifting > 10 pounds until cleared by Psychologist, sport and exercise. Note given.   Of note, 4 months sober!  Follow up: f/u as scheduled for cpe  10/09/2019  Orders Placed This Encounter  Procedures  . Ambulatory referral to General Surgery   Meds ordered this encounter  Medications  . diclofenac (VOLTAREN) 75 MG EC tablet    Sig: Take 1 tablet (75 mg total) by mouth 2 (two) times daily.    Dispense:  30 tablet    Refill:  0      I reviewed the patients updated PMH, FH, and SocHx.    Patient Active Problem List   Diagnosis Date Noted  . Alcoholism in recovery ([place]) 11/24/2017  . Fatty liver, alcoholic 36/64/4034  . IFG (impaired fasting glucose) 03/25/2015  . Class 1 obesity due to excess calories with serious comorbidity and body mass index (BMI) of 34.0 to 34.9 in adult 08/02/2013  . Essential hypertension with goal blood pressure less than 140/90 08/02/2013  . Attention deficit disorder (ADD) without hyperactivity 04/17/2013   Current Meds  Medication Sig  .  amLODipine (NORVASC) 10 MG tablet TAKE 1 TABLET(10 MG) BY MOUTH DAILY  . amphetamine-dextroamphetamine (ADDERALL XR) 30 MG 24 hr capsule Take 1 capsule (30 mg total) by mouth daily.  [name] amphetamine-dextroamphetamine (ADDERALL) 10 MG tablet Take 1 tablet (10 mg total) by mouth daily as needed.  [name] lisinopril-hydrochlorothiazide (PRINZIDE,ZESTORETIC) 20-12.5 MG tablet Take 2 tablets by mouth daily.    Allergies: Patient is allergic to strattera [atomoxetine hcl]. Family History: Patient family history includes Esophageal cancer in his maternal uncle; Healthy in his paternal uncle and son; Hypertension in his mother. Social History:  Patient  reports that he quit smoking about 3 years ago. His smoking use included cigarettes. He has a 45.00 pack-year smoking history. He has never used smokeless tobacco. He reports previous alcohol use. He reports that he does not use drugs.  Review of Systems: Constitutional: Negative for fever malaise or anorexia Cardiovascular: negative for chest pain Respiratory: negative for SOB or persistent cough Gastrointestinal: negative for abdominal pain  Objective  Vitals: BP (!) 142/84 (BP Location: Left Arm, Patient Position: Sitting, Cuff Size: Normal)   Pulse 94   Temp (!) 97.2 F (36.2 C) (Temporal)   Ht 5' 9"  (1.753 m)   Wt 241 lb (109.3 kg)   SpO2 98%   BMI 35.59 kg/m  General: no acute distress ,  A&Ox3 Cardiovascular:  RRR without murmur or gallop.  Respiratory:  Good breath sounds bilaterally, CTAB with normal respiratory effort Gastrointestinal: soft, flat abdomen, normal active bowel sounds, no palpable masses, no hepatosplenomegaly, no appreciated hernias GU: right inguinal hernia w/ ttp, nl left testicle w/o hernia Ext: right inner thing w/o swelling or ttp; decreased ROM of hip due to pain with internal and external rotation. Skin:  Warm, no rashes  toradol 99m IM given for pain. Some relief. No reaction   Commons side effects, risks,  benefits, and alternatives for medications and treatment plan prescribed today were discussed, and the patient expressed understanding of the given instructions. Patient is instructed to call or message via MyChart if he/she has any questions or concerns regarding our treatment plan. No barriers to understanding were identified. We discussed Red Flag symptoms and signs in detail. Patient expressed understanding regarding what to do in case of urgent or emergency type symptoms.   Medication list was reconciled, printed and provided to the patient in AVS. Patient instructions and summary information was reviewed with the patient as documented in the AVS. This note was prepared with assistance of Dragon voice recognition software. Occasional wrong-word or sound-a-like substitutions may have occurred due to the inherent limitations of voice recognition software  This visit occurred during the SARS-CoV-2 public health emergency.  Safety protocols were in place, including screening questions prior to the visit, additional usage of staff PPE, and extensive cleaning of exam room while observing appropriate contact time as indicated for disinfecting solutions.

## 2019-08-14 NOTE — Addendum Note (Signed)
Addended bySerita Sheller on: 08/14/2019 02:44 PM   Modules accepted: Orders

## 2019-08-14 NOTE — Patient Instructions (Signed)
Please follow up as scheduled for your next visit with me: 10/09/2019 for complete physical. Come fasting.  We will call you with information regarding your referral appointment. [employer]. If you do not hear from [place] within the next week, please let me know. I've placed an urgent referral so you can get evaluated quickly.    If you have any questions or concerns, please don't hesitate to send me a message via MyChart or call the office at [phone]. Thank you for visiting with [place] today! It's our pleasure caring for you.   Inguinal Hernia, Adult An inguinal hernia develops when fat or the intestines push through a weak spot in a muscle where your leg meets your lower abdomen (groin). This creates a bulge. This kind of hernia could also be:  In your scrotum, if you are male.  In folds of skin around your vagina, if you are male. There are three types of inguinal hernias:  Hernias that can be pushed back into the abdomen (are reducible). This type rarely causes pain.  Hernias that are not reducible (are incarcerated).  Hernias that are not reducible and lose their blood supply (are strangulated). This type of hernia requires emergency surgery. What are the causes? This condition is caused by having a weak spot in the muscles or tissues in the groin. This weak spot develops over time. The hernia may poke through the weak spot when you suddenly strain your lower abdominal muscles, such as when you:  Lift a heavy object.  Strain to have a bowel movement. Constipation can lead to straining.  Cough. What increases the risk? This condition is more likely to develop in:  Men.  Pregnant women.  People who: ? Are overweight. ? Work in jobs that require long periods of standing or heavy lifting. ? Have had an inguinal hernia before. ? Smoke or have lung disease. These factors can lead to long-lasting (chronic) coughing. What are the signs or symptoms? Symptoms may  depend on the size of the hernia. Often, a small inguinal hernia has no symptoms. Symptoms of a larger hernia may include:  A lump in the groin area. This is easier to see when standing. It might not be visible when lying down.  Pain or burning in the groin. This may get worse when lifting, straining, or coughing.  A dull ache or a feeling of pressure in the groin.  In men, an unusual lump in the scrotum. Symptoms of a strangulated inguinal hernia may include:  A bulge in your groin that is very painful and tender to the touch.  A bulge that turns red or purple.  Fever, nausea, and vomiting.  Inability to have a bowel movement or to pass gas. How is this diagnosed? This condition is diagnosed based on your symptoms, your medical history, and a physical exam. Your health care provider may feel your groin area and ask you to cough. How is this treated? Treatment depends on the size of your hernia and whether you have symptoms. If you do not have symptoms, your health care provider may have you watch your hernia carefully and have you come in for follow-up visits. If your hernia is large or if you have symptoms, you may need surgery to repair the hernia. Follow these instructions at home: Lifestyle  Avoid lifting heavy objects.  Avoid standing for long periods of time.  Do not use any products that contain nicotine or tobacco, such as cigarettes and e-cigarettes. If you need  help quitting, ask your health care provider.  Maintain a healthy weight. Preventing constipation  Take actions to prevent constipation. Constipation leads to straining with bowel movements, which can make a hernia worse or cause a hernia repair to break down. Your health care provider may recommend that you: ? Drink enough fluid to keep your urine pale yellow. ? Eat foods that are high in fiber, such as fresh fruits and vegetables, whole grains, and beans. ? Limit foods that are high in fat and processed  sugars, such as fried or sweet foods. ? Take an over-the-counter or prescription medicine for constipation. General instructions  You may try to push the hernia back in place by very gently pressing on it while lying down. Do not try to force the bulge back in if it will not push in easily.  Watch your hernia for any changes in shape, size, or color. Get help right away if you notice any changes.  Take over-the-counter and prescription medicines only as told by your health care provider.  Keep all follow-up visits as told by your health care provider. This is important. Contact a health care provider if:  You have a fever.  You develop new symptoms.  Your symptoms get worse. Get help right away if:  You have pain in your groin that suddenly gets worse.  You have a bulge in your groin that: ? Suddenly gets bigger and does not get smaller. ? Becomes red or purple or painful to the touch.  You are a man and you have a sudden pain in your scrotum, or the size of your scrotum suddenly changes.  You cannot push the hernia back in place by very gently pressing on it when you are lying down. Do not try to force the bulge back in if it will not push in easily.  You have nausea or vomiting that does not go away.  You have a fast heartbeat.  You cannot have a bowel movement or pass gas. These symptoms may represent a serious problem that is an emergency. Do not wait to see if the symptoms will go away. Get medical help right away. Call your local emergency services (911 in the [place] Summary  An inguinal hernia develops when fat or the intestines push through a weak spot in a muscle where your leg meets your lower abdomen (groin).  This condition is caused by having a weak spot in muscles or tissue in your groin.  Symptoms may depend on the size of the hernia, and they may include pain or swelling in your groin. A small inguinal hernia often has no symptoms.  Treatment may not be  needed if you do not have symptoms. If you have symptoms or a large hernia, you may need surgery to repair the hernia.  Avoid lifting heavy objects. Also avoid standing for long amounts of time. This information is not intended to replace advice given to you by your health care provider. Make sure you discuss any questions you have with your health care provider. Document Released: 01/02/2009 Document Revised: 09/17/2017 Document Reviewed: 05/18/2017 Elsevier Patient Education  2020 [employer].

## 2019-08-16 ENCOUNTER — Encounter: Payer: Self-pay | Admitting: Family Medicine

## 2019-08-20 MED ORDER — TRAMADOL HCL 50 MG PO TABS: 50.0000 mg | ORAL_TABLET | Freq: Three times a day (TID) | ORAL | 0 refills | Status: AC | PRN

## 2019-09-05 ENCOUNTER — Ambulatory Visit: Payer: Self-pay | Admitting: Surgery

## 2019-09-05 ENCOUNTER — Other Ambulatory Visit: Payer: Self-pay | Admitting: Family Medicine

## 2019-09-05 DIAGNOSIS — K4090 Unilateral inguinal hernia, without obstruction or gangrene, not specified as recurrent: Secondary | ICD-10-CM | POA: Diagnosis not present

## 2019-09-05 NOTE — H&P (Signed)
Surgical H&P Requesting provider: Dr. Rosendo Gros  CC: groin pain  HPI: This is a very pleasant 49 year old gentleman, otherwise healthy, who has been having intermittent right groin pain for several months. This is exacerbated by lifting and physical activity. He initially thought that he had a groin pull the pain radiates down into his right medial thigh. Denies associated GI symptoms. Denies prior abdominal or groin surgery.  Of note he is 5 months sober in recovery and receives monthly naltrexone injections. He is eager not to jeopardize his progress in hopes to minimize perioperative narcotic use. He may also need to time his injection so that intraoperative anesthesia narcotics will be effective.  Allergies  Allergen Reactions  . Strattera [Atomoxetine Hcl] Hives    Past Medical History:  Diagnosis Date  . Anxiety   . Depression   . Hypertension     Past Surgical History:  Procedure Laterality Date  . KNEE ARTHRODESIS Right 2002  . MOUTH SURGERY     Dentures 09/15/2017    Family History  Problem Relation Age of Onset  . Hypertension Mother   . Esophageal cancer Maternal Uncle   . Healthy Son   . Healthy Paternal Uncle   . Diabetes Neg Hx   . Heart disease Neg Hx     Social History   Socioeconomic History  . Marital status: Married    Spouse name: Marliss Czar  . Number of children: 1  . Years of education: Not on file  . Highest education level: Not on file  Occupational History  . Occupation: Camera operator, customer service associate  Tobacco Use  . Smoking status: Former Smoker    Packs/day: 1.50    Years: 30.00    Pack years: 45.00    Types: Cigarettes    Quit date: 11/29/2015    Years since quitting: 3.7  . Smokeless tobacco: Never Used  Substance and Sexual Activity  . Alcohol use: Not Currently    Comment: alcoholic, quit 11/7827  . Drug use: No  . Sexual activity: Yes    Partners: Female  Other Topics Concern  . Not on file  Social History  Narrative  . Not on file   Social Determinants of Health   Financial Resource Strain:   . Difficulty of Paying Living Expenses: Not on file  Food Insecurity:   . Worried About Charity fundraiser in the Last Year: Not on file  . Ran Out of Food in the Last Year: Not on file  Transportation Needs:   . Lack of Transportation (Medical): Not on file  . Lack of Transportation (Non-Medical): Not on file  Physical Activity:   . Days of Exercise per Week: Not on file  . Minutes of Exercise per Session: Not on file  Stress:   . Feeling of Stress : Not on file  Social Connections:   . Frequency of Communication with Friends and Family: Not on file  . Frequency of Social Gatherings with Friends and Family: Not on file  . Attends Religious Services: Not on file  . Active Member of Clubs or Organizations: Not on file  . Attends Archivist Meetings: Not on file  . Marital Status: Not on file    Current Outpatient Medications on File Prior to Visit  Medication Sig Dispense Refill  . amLODipine (NORVASC) 10 MG tablet TAKE 1 TABLET(10 MG) BY MOUTH DAILY 90 tablet 3  . amphetamine-dextroamphetamine (ADDERALL XR) 30 MG 24 hr capsule Take 1 capsule (30 mg total) by  mouth daily. 30 capsule 0  . amphetamine-dextroamphetamine (ADDERALL) 10 MG tablet Take 1 tablet (10 mg total) by mouth daily as needed. 30 tablet 0  . diclofenac (VOLTAREN) 75 MG EC tablet Take 1 tablet (75 mg total) by mouth 2 (two) times daily. 30 tablet 0  . lisinopril-hydrochlorothiazide (PRINZIDE,ZESTORETIC) 20-12.5 MG tablet Take 2 tablets by mouth daily. 180 tablet 3  . Naltrexone (VIVITROL) 380 MG SUSR Inject 380 mg into the muscle every 30 (thirty) days.    . traZODone (DESYREL) 50 MG tablet Take 1-3 tablets (50-150 mg total) by mouth at bedtime. (Patient not taking: Reported on 08/14/2019) 90 tablet 2   No current facility-administered medications on file prior to visit.    Review of Systems: a complete, 10pt  review of systems was completed with pertinent positives and negatives as documented in the HPI  Physical Exam: Vitals  Weight: 240.8 lb Height: 70in Body Surface Area: 2.26 m Body Mass Index: 34.55 kg/m  Temp.: 97.86F  Pulse: 102 (Regular)  BP: 136/84 (Sitting, Left Arm, Standard)  Alert and well-appearing Extraocular motion intact, no scleral icterus Unlabored respirations Abdomen soft and nontender. There is a reducible right inguinal hernia which is tender. There is also likely a left inguinal hernia present with Valsalva   CBC Latest Ref Rng & Units 09/19/2018 09/26/2017 09/14/2016  WBC 4.0 - 10.5 K/uL 7.4 11.3(H) 11.2  Hemoglobin 13.0 - 17.0 g/dL 17.1(H) 16.3 15.2  Hematocrit 39.0 - 52.0 % 48.6 47.5 44  Platelets 150.0 - 400.0 K/uL 247.0 277.0 -    CMP Latest Ref Rng & Units 09/19/2018 03/21/2018 11/24/2017  Glucose 70 - 99 mg/dL 113(H) - -  BUN 6 - 23 mg/dL 10 - -  Creatinine 0.40 - 1.50 mg/dL 0.69 - -  Sodium 135 - 145 mEq/L 137 - -  Potassium 3.5 - 5.1 mEq/L 4.2 - -  Chloride 96 - 112 mEq/L 99 - -  CO2 19 - 32 mEq/L 29 - -  Calcium 8.4 - 10.5 mg/dL 9.7 - -  Total Protein 6.0 - 8.3 g/dL 8.0 8.5(H) 8.0  Total Bilirubin 0.2 - 1.2 mg/dL 0.8 0.5 0.8  Alkaline Phos 39 - 117 U/L 67 74 66  AST 0 - 37 U/L 115(H) 83(H) 136(H)  ALT 0 - 53 U/L 96(H) 96(H) 153(H)    No results found for: INR, PROTIME  Imaging: No results found.   A/P: INGUINAL HERNIA (K40.90) Story: I recommend a laparoscopic/robotic approach given potential for bilateral repair. We discussed the surgery including the technical details, risks of bleeding, infection, pain, scarring, injury to intra-abdominal or intracranial structures including bowel, bladder, blood vessels, nerves, gonadal vessels or vas. Discussed risk of hernia recurrence. Discussed need for 6 weeks of no heavy lifting or strenuous activity questions were answered. He would like to schedule surgery.  Patient Active Problem List    Diagnosis Date Noted  . Alcoholism in recovery ([place]) 11/24/2017  . Fatty liver, alcoholic 45/80/9983  . IFG (impaired fasting glucose) 03/25/2015  . Class 1 obesity due to excess calories with serious comorbidity and body mass index (BMI) of 34.0 to 34.9 in adult 08/02/2013  . Essential hypertension with goal blood pressure less than 140/90 08/02/2013  . Attention deficit disorder (ADD) without hyperactivity 04/17/2013       Romana Juniper, MD [facility]  See AMION to contact appropriate on-call provider

## 2019-09-05 NOTE — H&P (View-Only) (Signed)
Surgical H&P Requesting provider: Dr. Rosendo Gros  CC: groin pain  HPI: This is a very pleasant 49 year old gentleman, otherwise healthy, who has been having intermittent right groin pain for several months. This is exacerbated by lifting and physical activity. He initially thought that he had a groin pull the pain radiates down into his right medial thigh. Denies associated GI symptoms. Denies prior abdominal or groin surgery.  Of note he is 5 months sober in recovery and receives monthly naltrexone injections. He is eager not to jeopardize his progress in hopes to minimize perioperative narcotic use. He may also need to time his injection so that intraoperative anesthesia narcotics will be effective.  Allergies  Allergen Reactions  . Strattera [Atomoxetine Hcl] Hives    Past Medical History:  Diagnosis Date  . Anxiety   . Depression   . Hypertension     Past Surgical History:  Procedure Laterality Date  . KNEE ARTHRODESIS Right 2002  . MOUTH SURGERY     Dentures 09/15/2017    Family History  Problem Relation Age of Onset  . Hypertension Mother   . Esophageal cancer Maternal Uncle   . Healthy Son   . Healthy Paternal Uncle   . Diabetes Neg Hx   . Heart disease Neg Hx     Social History   Socioeconomic History  . Marital status: Married    Spouse name: Marliss Czar  . Number of children: 1  . Years of education: Not on file  . Highest education level: Not on file  Occupational History  . Occupation: Camera operator, customer service associate  Tobacco Use  . Smoking status: Former Smoker    Packs/day: 1.50    Years: 30.00    Pack years: 45.00    Types: Cigarettes    Quit date: 11/29/2015    Years since quitting: 3.7  . Smokeless tobacco: Never Used  Substance and Sexual Activity  . Alcohol use: Not Currently    Comment: alcoholic, quit 11/100  . Drug use: No  . Sexual activity: Yes    Partners: Female  Other Topics Concern  . Not on file  Social History  Narrative  . Not on file   Social Determinants of Health   Financial Resource Strain:   . Difficulty of Paying Living Expenses: Not on file  Food Insecurity:   . Worried About Charity fundraiser in the Last Year: Not on file  . Ran Out of Food in the Last Year: Not on file  Transportation Needs:   . Lack of Transportation (Medical): Not on file  . Lack of Transportation (Non-Medical): Not on file  Physical Activity:   . Days of Exercise per Week: Not on file  . Minutes of Exercise per Session: Not on file  Stress:   . Feeling of Stress : Not on file  Social Connections:   . Frequency of Communication with Friends and Family: Not on file  . Frequency of Social Gatherings with Friends and Family: Not on file  . Attends Religious Services: Not on file  . Active Member of Clubs or Organizations: Not on file  . Attends Archivist Meetings: Not on file  . Marital Status: Not on file    Current Outpatient Medications on File Prior to Visit  Medication Sig Dispense Refill  . amLODipine (NORVASC) 10 MG tablet TAKE 1 TABLET(10 MG) BY MOUTH DAILY 90 tablet 3  . amphetamine-dextroamphetamine (ADDERALL XR) 30 MG 24 hr capsule Take 1 capsule (30 mg total) by  mouth daily. 30 capsule 0  . amphetamine-dextroamphetamine (ADDERALL) 10 MG tablet Take 1 tablet (10 mg total) by mouth daily as needed. 30 tablet 0  . diclofenac (VOLTAREN) 75 MG EC tablet Take 1 tablet (75 mg total) by mouth 2 (two) times daily. 30 tablet 0  . lisinopril-hydrochlorothiazide (PRINZIDE,ZESTORETIC) 20-12.5 MG tablet Take 2 tablets by mouth daily. 180 tablet 3  . Naltrexone (VIVITROL) 380 MG SUSR Inject 380 mg into the muscle every 30 (thirty) days.    . traZODone (DESYREL) 50 MG tablet Take 1-3 tablets (50-150 mg total) by mouth at bedtime. (Patient not taking: Reported on 08/14/2019) 90 tablet 2   No current facility-administered medications on file prior to visit.    Review of Systems: a complete, 10pt  review of systems was completed with pertinent positives and negatives as documented in the HPI  Physical Exam: Vitals  Weight: 240.8 lb Height: 70in Body Surface Area: 2.26 m Body Mass Index: 34.55 kg/m  Temp.: 97.29F  Pulse: 102 (Regular)  BP: 136/84 (Sitting, Left Arm, Standard)  Alert and well-appearing Extraocular motion intact, no scleral icterus Unlabored respirations Abdomen soft and nontender. There is a reducible right inguinal hernia which is tender. There is also likely a left inguinal hernia present with Valsalva   CBC Latest Ref Rng & Units 09/19/2018 09/26/2017 09/14/2016  WBC 4.0 - 10.5 K/uL 7.4 11.3(H) 11.2  Hemoglobin 13.0 - 17.0 g/dL 17.1(H) 16.3 15.2  Hematocrit 39.0 - 52.0 % 48.6 47.5 44  Platelets 150.0 - 400.0 K/uL 247.0 277.0 -    CMP Latest Ref Rng & Units 09/19/2018 03/21/2018 11/24/2017  Glucose 70 - 99 mg/dL 113(H) - -  BUN 6 - 23 mg/dL 10 - -  Creatinine 0.40 - 1.50 mg/dL 0.69 - -  Sodium 135 - 145 mEq/L 137 - -  Potassium 3.5 - 5.1 mEq/L 4.2 - -  Chloride 96 - 112 mEq/L 99 - -  CO2 19 - 32 mEq/L 29 - -  Calcium 8.4 - 10.5 mg/dL 9.7 - -  Total Protein 6.0 - 8.3 g/dL 8.0 8.5(H) 8.0  Total Bilirubin 0.2 - 1.2 mg/dL 0.8 0.5 0.8  Alkaline Phos 39 - 117 U/L 67 74 66  AST 0 - 37 U/L 115(H) 83(H) 136(H)  ALT 0 - 53 U/L 96(H) 96(H) 153(H)    No results found for: INR, PROTIME  Imaging: No results found.   A/P: INGUINAL HERNIA (K40.90) Story: I recommend a laparoscopic/robotic approach given potential for bilateral repair. We discussed the surgery including the technical details, risks of bleeding, infection, pain, scarring, injury to intra-abdominal or intracranial structures including bowel, bladder, blood vessels, nerves, gonadal vessels or vas. Discussed risk of hernia recurrence. Discussed need for 6 weeks of no heavy lifting or strenuous activity questions were answered. He would like to schedule surgery.  Patient Active Problem List    Diagnosis Date Noted  . Alcoholism in recovery ([place]) 11/24/2017  . Fatty liver, alcoholic 79/39/0300  . IFG (impaired fasting glucose) 03/25/2015  . Class 1 obesity due to excess calories with serious comorbidity and body mass index (BMI) of 34.0 to 34.9 in adult 08/02/2013  . Essential hypertension with goal blood pressure less than 140/90 08/02/2013  . Attention deficit disorder (ADD) without hyperactivity 04/17/2013       Romana Juniper, MD [facility]  See AMION to contact appropriate on-call provider

## 2019-09-05 NOTE — Telephone Encounter (Signed)
Rx request 

## 2019-09-06 MED ORDER — AMPHETAMINE-DEXTROAMPHET ER 30 MG PO CP24: 30.0000 mg | ORAL_CAPSULE | Freq: Every day | ORAL | 0 refills | Status: DC

## 2019-09-06 MED ORDER — AMPHETAMINE-DEXTROAMPHETAMINE 10 MG PO TABS: 10.0000 mg | ORAL_TABLET | Freq: Every day | ORAL | 0 refills | Status: DC | PRN

## 2019-09-19 ENCOUNTER — Other Ambulatory Visit: Payer: Self-pay

## 2019-09-19 ENCOUNTER — Encounter (HOSPITAL_BASED_OUTPATIENT_CLINIC_OR_DEPARTMENT_OTHER): Payer: Self-pay | Admitting: Surgery

## 2019-09-19 NOTE — Progress Notes (Addendum)
Spoke w/ via phone for pre-op interview---Eyob Lab needs dos----   Cbc, cmet, ekg            Lab results------ COVID test ------09-21-2019  Arrive at -------530 am 09-25-2019 NPO after ------midnight Medications to take morning of surgery -----amlodipine Diabetic medication -----n/a Patient Special Instructions ----- Pre-Op special Istructions -----hibiclens shower hs  And am of surgery Patient verbalized understanding of instructions that were given at this phone interview. Patient denies shortness of breath, chest pain, fever, cough a this phone interview.

## 2019-09-21 ENCOUNTER — Other Ambulatory Visit (HOSPITAL_COMMUNITY)
Admission: RE | Admit: 2019-09-21 | Discharge: 2019-09-21 | Disposition: A | Discharge status: Home / Self Care | Payer: BC Managed Care – PPO | Source: Ambulatory Visit | Attending: Surgery | Admitting: Surgery

## 2019-09-21 DIAGNOSIS — Z20822 Contact with and (suspected) exposure to covid-19: Secondary | ICD-10-CM | POA: Insufficient documentation

## 2019-09-21 DIAGNOSIS — Z01812 Encounter for preprocedural laboratory examination: Secondary | ICD-10-CM | POA: Diagnosis not present

## 2019-09-21 LAB — SARS CORONAVIRUS 2 (TAT 6-24 HRS): SARS Coronavirus 2: NEGATIVE

## 2019-09-24 MED ORDER — BUPIVACAINE LIPOSOME 1.3 % IJ SUSP
20.0000 mL | Freq: Once | INTRAMUSCULAR | Status: DC
  Filled 2019-09-24 (×2): qty 20

## 2019-09-25 ENCOUNTER — Ambulatory Visit (HOSPITAL_COMMUNITY)
Admission: RE | Admit: 2019-09-25 | Discharge: 2019-09-25 | Disposition: A | Discharge status: Home / Self Care | Payer: BC Managed Care – PPO | Attending: Surgery | Admitting: Surgery

## 2019-09-25 ENCOUNTER — Encounter (HOSPITAL_BASED_OUTPATIENT_CLINIC_OR_DEPARTMENT_OTHER): Payer: Self-pay | Admitting: Surgery

## 2019-09-25 ENCOUNTER — Other Ambulatory Visit: Payer: Self-pay

## 2019-09-25 ENCOUNTER — Ambulatory Visit (HOSPITAL_BASED_OUTPATIENT_CLINIC_OR_DEPARTMENT_OTHER): Payer: BC Managed Care – PPO | Admitting: Anesthesiology

## 2019-09-25 ENCOUNTER — Encounter (HOSPITAL_BASED_OUTPATIENT_CLINIC_OR_DEPARTMENT_OTHER)
Admission: RE | Disposition: A | Discharge status: Home / Self Care | Payer: Self-pay | Source: Home / Self Care | Attending: Surgery

## 2019-09-25 DIAGNOSIS — Z791 Long term (current) use of non-steroidal anti-inflammatories (NSAID): Secondary | ICD-10-CM | POA: Diagnosis not present

## 2019-09-25 DIAGNOSIS — Z6835 Body mass index (BMI) 35.0-35.9, adult: Secondary | ICD-10-CM | POA: Insufficient documentation

## 2019-09-25 DIAGNOSIS — K700 Alcoholic fatty liver: Secondary | ICD-10-CM | POA: Insufficient documentation

## 2019-09-25 DIAGNOSIS — Z79899 Other long term (current) drug therapy: Secondary | ICD-10-CM | POA: Insufficient documentation

## 2019-09-25 DIAGNOSIS — E669 Obesity, unspecified: Secondary | ICD-10-CM | POA: Diagnosis not present

## 2019-09-25 DIAGNOSIS — Z87891 Personal history of nicotine dependence: Secondary | ICD-10-CM | POA: Diagnosis not present

## 2019-09-25 DIAGNOSIS — F988 Other specified behavioral and emotional disorders with onset usually occurring in childhood and adolescence: Secondary | ICD-10-CM | POA: Diagnosis not present

## 2019-09-25 DIAGNOSIS — K4020 Bilateral inguinal hernia, without obstruction or gangrene, not specified as recurrent: Secondary | ICD-10-CM | POA: Insufficient documentation

## 2019-09-25 DIAGNOSIS — F418 Other specified anxiety disorders: Secondary | ICD-10-CM | POA: Diagnosis not present

## 2019-09-25 DIAGNOSIS — I10 Essential (primary) hypertension: Secondary | ICD-10-CM | POA: Insufficient documentation

## 2019-09-25 HISTORY — DX: Unilateral inguinal hernia, without obstruction or gangrene, not specified as recurrent: K40.90

## 2019-09-25 HISTORY — PX: XI ROBOTIC ASSISTED INGUINAL HERNIA REPAIR WITH MESH: SHX6706

## 2019-09-25 LAB — CBC
HCT: 48.3 % (ref 39.0–52.0)
Hemoglobin: 16.7 g/dL (ref 13.0–17.0)
MCH: 29.3 pg (ref 26.0–34.0)
MCHC: 34.6 g/dL (ref 30.0–36.0)
MCV: 84.7 fL (ref 80.0–100.0)
Platelets: 319 10*3/uL (ref 150–400)
RBC: 5.7 MIL/uL (ref 4.22–5.81)
RDW: 12.9 % (ref 11.5–15.5)
WBC: 9.6 10*3/uL (ref 4.0–10.5)
nRBC: 0 % (ref 0.0–0.2)

## 2019-09-25 LAB — COMPREHENSIVE METABOLIC PANEL
ALT: 29 U/L (ref 0–44)
AST: 26 U/L (ref 15–41)
Albumin: 4.4 g/dL (ref 3.5–5.0)
Alkaline Phosphatase: 64 U/L (ref 38–126)
Anion gap: 10 (ref 5–15)
BUN: 13 mg/dL (ref 6–20)
CO2: 25 mmol/L (ref 22–32)
Calcium: 9.7 mg/dL (ref 8.9–10.3)
Chloride: 104 mmol/L (ref 98–111)
Creatinine, Ser: 0.61 mg/dL (ref 0.61–1.24)
GFR calc Af Amer: >60 mL/min (ref >60)
GFR calc non Af Amer: >60 mL/min (ref >60)
Glucose, Bld: 134 mg/dL — ABNORMAL HIGH (ref 70–99)
Potassium: 4 mmol/L (ref 3.5–5.1)
Sodium: 139 mmol/L (ref 135–145)
Total Bilirubin: 0.7 mg/dL (ref 0.3–1.2)
Total Protein: 8.1 g/dL (ref 6.5–8.1)

## 2019-09-25 SURGERY — REPAIR, HERNIA, INGUINAL, ROBOT-ASSISTED, LAPAROSCOPIC, USING MESH
Anesthesia: General | Site: Abdomen | Laterality: Bilateral

## 2019-09-25 MED ORDER — ONDANSETRON HCL 4 MG/2ML IJ SOLN
INTRAMUSCULAR | Status: AC
  Filled 2019-09-25: qty 2

## 2019-09-25 MED ORDER — SODIUM CHLORIDE 0.9 % IV SOLN
250.0000 mL | INTRAVENOUS | Status: DC | PRN
  Filled 2019-09-25: qty 250

## 2019-09-25 MED ORDER — METHOCARBAMOL 500 MG PO TABS: 500.0000 mg | ORAL_TABLET | Freq: Four times a day (QID) | ORAL | 1 refills | Status: DC

## 2019-09-25 MED ORDER — GABAPENTIN 300 MG PO CAPS: 300.0000 mg | ORAL_CAPSULE | Freq: Three times a day (TID) | ORAL | 1 refills | Status: DC

## 2019-09-25 MED ORDER — MIDAZOLAM HCL 2 MG/2ML IJ SOLN
INTRAMUSCULAR | Status: AC
  Filled 2019-09-25: qty 2

## 2019-09-25 MED ORDER — GABAPENTIN 300 MG PO CAPS
ORAL_CAPSULE | ORAL | Status: AC
  Filled 2019-09-25: qty 1

## 2019-09-25 MED ORDER — SODIUM CHLORIDE 0.9% FLUSH
3.0000 mL | Freq: Two times a day (BID) | INTRAVENOUS | Status: DC
  Filled 2019-09-25: qty 3

## 2019-09-25 MED ORDER — ACETAMINOPHEN 500 MG PO TABS: 1000.0000 mg | ORAL_TABLET | Freq: Four times a day (QID) | ORAL | 0 refills | Status: AC

## 2019-09-25 MED ORDER — FENTANYL CITRATE (PF) 100 MCG/2ML IJ SOLN
INTRAMUSCULAR | Status: DC | PRN
  Administered 2019-09-25: 50 ug via INTRAVENOUS
  Administered 2019-09-25 (×3): 150 ug via INTRAVENOUS

## 2019-09-25 MED ORDER — CEFAZOLIN SODIUM-DEXTROSE 2-4 GM/100ML-% IV SOLN
2.0000 g | INTRAVENOUS | Status: AC
  Administered 2019-09-25: 2 g via INTRAVENOUS
  Filled 2019-09-25: qty 100

## 2019-09-25 MED ORDER — ACETAMINOPHEN 500 MG PO TABS
1000.0000 mg | ORAL_TABLET | ORAL | Status: AC
  Administered 2019-09-25: 06:00:00 1000 mg via ORAL
  Filled 2019-09-25: qty 2

## 2019-09-25 MED ORDER — 0.9 % SODIUM CHLORIDE (POUR BTL) OPTIME
TOPICAL | Status: DC | PRN
  Administered 2019-09-25: 1000 mL

## 2019-09-25 MED ORDER — KETOROLAC TROMETHAMINE 30 MG/ML IJ SOLN
INTRAMUSCULAR | Status: AC
  Filled 2019-09-25: qty 1

## 2019-09-25 MED ORDER — OXYCODONE HCL 5 MG/5ML PO SOLN
5.0000 mg | Freq: Once | ORAL | Status: DC | PRN
  Filled 2019-09-25: qty 5

## 2019-09-25 MED ORDER — ACETAMINOPHEN 325 MG PO TABS
ORAL_TABLET | ORAL | Status: AC
  Filled 2019-09-25: qty 2

## 2019-09-25 MED ORDER — PROMETHAZINE HCL 25 MG/ML IJ SOLN
6.2500 mg | INTRAMUSCULAR | Status: DC | PRN
  Filled 2019-09-25: qty 1

## 2019-09-25 MED ORDER — DEXAMETHASONE SODIUM PHOSPHATE 10 MG/ML IJ SOLN
INTRAMUSCULAR | Status: AC
  Filled 2019-09-25: qty 1

## 2019-09-25 MED ORDER — LABETALOL HCL 5 MG/ML IV SOLN
INTRAVENOUS | Status: DC | PRN
  Administered 2019-09-25: 5 mg via INTRAVENOUS

## 2019-09-25 MED ORDER — MEPERIDINE HCL 25 MG/ML IJ SOLN
6.2500 mg | INTRAMUSCULAR | Status: DC | PRN
  Filled 2019-09-25: qty 1

## 2019-09-25 MED ORDER — CEFAZOLIN SODIUM-DEXTROSE 2-4 GM/100ML-% IV SOLN
INTRAVENOUS | Status: AC
  Filled 2019-09-25: qty 100

## 2019-09-25 MED ORDER — ROCURONIUM BROMIDE 10 MG/ML (PF) SYRINGE
PREFILLED_SYRINGE | INTRAVENOUS | Status: DC | PRN
  Administered 2019-09-25: 20 mg via INTRAVENOUS
  Administered 2019-09-25: 70 mg via INTRAVENOUS
  Administered 2019-09-25: 20 mg via INTRAVENOUS
  Administered 2019-09-25 (×2): 10 mg via INTRAVENOUS

## 2019-09-25 MED ORDER — IBUPROFEN 800 MG PO TABS: 800.0000 mg | ORAL_TABLET | Freq: Three times a day (TID) | ORAL | 0 refills | Status: DC | PRN

## 2019-09-25 MED ORDER — GABAPENTIN 300 MG PO CAPS
300.0000 mg | ORAL_CAPSULE | ORAL | Status: AC
  Administered 2019-09-25: 06:00:00 300 mg via ORAL
  Filled 2019-09-25: qty 1

## 2019-09-25 MED ORDER — SUGAMMADEX SODIUM 500 MG/5ML IV SOLN
INTRAVENOUS | Status: DC | PRN
  Administered 2019-09-25: 300 mg via INTRAVENOUS

## 2019-09-25 MED ORDER — FENTANYL CITRATE (PF) 250 MCG/5ML IJ SOLN
INTRAMUSCULAR | Status: AC
  Filled 2019-09-25: qty 5

## 2019-09-25 MED ORDER — HYDROMORPHONE HCL 1 MG/ML IJ SOLN
0.2500 mg | INTRAMUSCULAR | Status: DC | PRN
  Filled 2019-09-25: qty 0.5

## 2019-09-25 MED ORDER — ACETAMINOPHEN 650 MG RE SUPP
650.0000 mg | RECTAL | Status: DC | PRN
  Filled 2019-09-25: qty 1

## 2019-09-25 MED ORDER — ACETAMINOPHEN 325 MG PO TABS
650.0000 mg | ORAL_TABLET | ORAL | Status: DC | PRN
  Administered 2019-09-25: 11:00:00 650 mg via ORAL
  Filled 2019-09-25: qty 2

## 2019-09-25 MED ORDER — PROPOFOL 10 MG/ML IV BOLUS
INTRAVENOUS | Status: DC | PRN
  Administered 2019-09-25: 50 mg via INTRAVENOUS
  Administered 2019-09-25: 240 mg via INTRAVENOUS

## 2019-09-25 MED ORDER — ONDANSETRON HCL 4 MG/2ML IJ SOLN
INTRAMUSCULAR | Status: DC | PRN
  Administered 2019-09-25: 4 mg via INTRAVENOUS

## 2019-09-25 MED ORDER — LACTATED RINGERS IV SOLN
INTRAVENOUS | Status: DC
  Administered 2019-09-25: 06:00:00 50 mL/h via INTRAVENOUS
  Filled 2019-09-25: qty 1000

## 2019-09-25 MED ORDER — CHLORHEXIDINE GLUCONATE 4 % EX LIQD
60.0000 mL | Freq: Once | CUTANEOUS | Status: DC
  Filled 2019-09-25: qty 118

## 2019-09-25 MED ORDER — BUPIVACAINE LIPOSOME 1.3 % IJ SUSP
INTRAMUSCULAR | Status: DC | PRN
  Administered 2019-09-25: 20 mL

## 2019-09-25 MED ORDER — ACETAMINOPHEN 500 MG PO TABS
ORAL_TABLET | ORAL | Status: AC
  Filled 2019-09-25: qty 1

## 2019-09-25 MED ORDER — KETOROLAC TROMETHAMINE 15 MG/ML IJ SOLN
15.0000 mg | Freq: Four times a day (QID) | INTRAMUSCULAR | Status: DC | PRN
  Administered 2019-09-25: 15 mg via INTRAVENOUS
  Filled 2019-09-25: qty 1

## 2019-09-25 MED ORDER — SODIUM CHLORIDE 0.9 % IR SOLN
Status: DC | PRN
  Administered 2019-09-25: 1000 mL

## 2019-09-25 MED ORDER — LIDOCAINE 2% (20 MG/ML) 5 ML SYRINGE
INTRAMUSCULAR | Status: DC | PRN
  Administered 2019-09-25: 25 mg via INTRAVENOUS
  Administered 2019-09-25: 75 mg via INTRAVENOUS

## 2019-09-25 MED ORDER — OXYCODONE HCL 5 MG PO TABS
5.0000 mg | ORAL_TABLET | Freq: Once | ORAL | Status: DC | PRN
  Filled 2019-09-25: qty 1

## 2019-09-25 MED ORDER — DOCUSATE SODIUM 100 MG PO CAPS: 100.0000 mg | ORAL_CAPSULE | Freq: Two times a day (BID) | ORAL | 0 refills | Status: DC

## 2019-09-25 MED ORDER — MIDAZOLAM HCL 5 MG/5ML IJ SOLN
INTRAMUSCULAR | Status: DC | PRN
  Administered 2019-09-25 (×2): 2 mg via INTRAVENOUS

## 2019-09-25 MED ORDER — DEXAMETHASONE SODIUM PHOSPHATE 10 MG/ML IJ SOLN
INTRAMUSCULAR | Status: DC | PRN
  Administered 2019-09-25: 10 mg via INTRAVENOUS

## 2019-09-25 MED ORDER — BUPIVACAINE HCL (PF) 0.25 % IJ SOLN
INTRAMUSCULAR | Status: DC | PRN
  Administered 2019-09-25: 30 mL

## 2019-09-25 MED ORDER — SODIUM CHLORIDE 0.9% FLUSH
3.0000 mL | INTRAVENOUS | Status: DC | PRN
  Filled 2019-09-25: qty 3

## 2019-09-25 SURGICAL SUPPLY — 45 items
BLADE SURG SZ11 CARB STEEL (BLADE) ×2 IMPLANT
CHLORAPREP W/TINT 26 (MISCELLANEOUS) ×2 IMPLANT
COVER SURGICAL LIGHT HANDLE (MISCELLANEOUS) ×2 IMPLANT
COVER TIP SHEARS 8 DVNC (MISCELLANEOUS) ×1 IMPLANT
COVER TIP SHEARS 8MM DA VINCI (MISCELLANEOUS) ×1
DERMABOND ADVANCED (GAUZE/BANDAGES/DRESSINGS) ×1
DERMABOND ADVANCED .7 DNX12 (GAUZE/BANDAGES/DRESSINGS) ×1 IMPLANT
DRAPE ARM DVNC X/XI (DISPOSABLE) ×4 IMPLANT
DRAPE COLUMN DVNC XI (DISPOSABLE) ×1 IMPLANT
DRAPE DA VINCI XI ARM (DISPOSABLE) ×4
DRAPE DA VINCI XI COLUMN (DISPOSABLE) ×1
ELECT REM PT RETURN 15FT ADLT (MISCELLANEOUS) ×2 IMPLANT
GLOVE BIO SURGEON STRL SZ 6 (GLOVE) ×4 IMPLANT
GLOVE BIO SURGEON STRL SZ7 (GLOVE) ×2 IMPLANT
GLOVE BIOGEL PI IND STRL 7.0 (GLOVE) ×2 IMPLANT
GLOVE BIOGEL PI INDICATOR 7.0 (GLOVE) ×2
GLOVE INDICATOR 6.5 STRL GRN (GLOVE) ×2 IMPLANT
GLOVE SURG SS PI 7.0 STRL IVOR (GLOVE) ×4 IMPLANT
GOWN STRL REUS W/TWL LRG LVL3 (GOWN DISPOSABLE) ×6 IMPLANT
GOWN STRL REUS W/TWL XL LVL3 (GOWN DISPOSABLE) ×2 IMPLANT
IRRIG SUCT STRYKERFLOW 2 WTIP (MISCELLANEOUS) ×2
IRRIGATION SUCT STRKRFLW 2 WTP (MISCELLANEOUS) ×1 IMPLANT
KIT BASIN OR (CUSTOM PROCEDURE TRAY) ×2 IMPLANT
MESH 3DMAX 4X6 LT LRG (Mesh General) IMPLANT
MESH 3DMAX 4X6 RT LRG (Mesh General) IMPLANT
MESH ULTRAPRO 3X6 7.6X15CM (Mesh General) ×4 IMPLANT
NEEDLE HYPO 22GX1.5 SAFETY (NEEDLE) ×2 IMPLANT
NEEDLE INSUFFLATION 14GA 120MM (NEEDLE) ×2 IMPLANT
PACK CARDIOVASCULAR III (CUSTOM PROCEDURE TRAY) ×2 IMPLANT
PAD POSITIONING PINK XL (MISCELLANEOUS) ×2 IMPLANT
SCISSORS LAP 5X45 EPIX DISP (ENDOMECHANICALS) ×2 IMPLANT
SEAL CANN UNIV 5-8 DVNC XI (MISCELLANEOUS) ×3 IMPLANT
SEAL XI 5MM-8MM UNIVERSAL (MISCELLANEOUS) ×3
SET TUBE SMOKE EVAC HIGH FLOW (TUBING) ×2 IMPLANT
SOL ANTI FOG 6CC (MISCELLANEOUS) ×1 IMPLANT
SOLUTION ANTI FOG 6CC (MISCELLANEOUS) ×1
SOLUTION ELECTROLUBE (MISCELLANEOUS) ×2 IMPLANT
SPONGE LAP 18X18 RF (DISPOSABLE) ×2 IMPLANT
SUT MNCRL AB 4-0 PS2 18 (SUTURE) ×2 IMPLANT
SUT VIC AB 3-0 SH 27 (SUTURE) ×5
SUT VIC AB 3-0 SH 27X BRD (SUTURE) ×4 IMPLANT
SUT VIC AB 3-0 SH 27XBRD (SUTURE) ×1 IMPLANT
SYR 20ML LL LF (SYRINGE) ×2 IMPLANT
TOWEL OR 17X26 10 PK STRL BLUE (TOWEL DISPOSABLE) ×2 IMPLANT
TOWEL OR NON WOVEN STRL DISP B (DISPOSABLE) ×2 IMPLANT

## 2019-09-25 NOTE — Anesthesia Preprocedure Evaluation (Addendum)
Anesthesia Evaluation  Patient identified by MRN, date of birth, ID band Patient awake    Reviewed: Allergy & Precautions, NPO status , Patient's Chart, lab work & pertinent test results  Airway Mallampati: II  TM Distance: >3 FB Neck ROM: Full    Dental no notable dental hx. (+) Edentulous Upper, Edentulous Lower   Pulmonary neg pulmonary ROS, former smoker,    Pulmonary exam normal breath sounds clear to auscultation       Cardiovascular hypertension, Pt. on medications negative cardio ROS Normal cardiovascular exam Rhythm:Regular Rate:Normal     Neuro/Psych Anxiety Depression negative neurological ROS  negative psych ROS   GI/Hepatic negative GI ROS, (+)     substance abuse  alcohol use,   Endo/Other  negative endocrine ROS  Renal/GU negative Renal ROS  negative genitourinary   Musculoskeletal negative musculoskeletal ROS (+)   Abdominal (+) + obese,   Peds negative pediatric ROS (+)  Hematology negative hematology ROS (+)   Anesthesia Other Findings   Reproductive/Obstetrics negative OB ROS                            Anesthesia Physical Anesthesia Plan  ASA: II  Anesthesia Plan: General   Post-op Pain Management:    Induction: Intravenous  PONV Risk Score and Plan: 2 and Ondansetron, Midazolam and Treatment may vary due to age or medical condition  Airway Management Planned: Oral ETT  Additional Equipment:   Intra-op Plan:   Post-operative Plan: Extubation in OR  Informed Consent: I have reviewed the patients History and Physical, chart, labs and discussed the procedure including the risks, benefits and alternatives for the proposed anesthesia with the patient or authorized representative who has indicated his/her understanding and acceptance.     Dental advisory given  Plan Discussed with: CRNA  Anesthesia Plan Comments:         Anesthesia Quick  Evaluation

## 2019-09-25 NOTE — Discharge Instructions (Signed)
HERNIA REPAIR: POST OP INSTRUCTIONS  ######################################################################  EAT Gradually transition to a high fiber diet with a fiber supplement over the next few weeks after discharge.  Start with a pureed / full liquid diet (see below)  WALK Walk an hour a day.  Control your pain to do that.    CONTROL PAIN Control pain so that you can walk, sleep, tolerate sneezing/coughing, and go up/down stairs.  HAVE A BOWEL MOVEMENT DAILY Keep your bowels regular to avoid problems.  OK to try a laxative to override constipation.  OK to use an antidairrheal to slow down diarrhea.  Call if not better after 2 tries  CALL IF YOU HAVE PROBLEMS/CONCERNS Call if you are still struggling despite following these instructions. Call if you have concerns not answered by these instructions  ######################################################################    1. DIET: Follow a light bland diet & liquids the first 24 hours after arrival home, such as soup, liquids, starches, etc.  Be sure to drink plenty of fluids.  Quickly advance to a usual solid diet within a few days.  Avoid fast food or heavy meals as your are more likely to get nauseated or have irregular bowels.  A low-sugar, high-fiber diet for the rest of your life is ideal.   2. Take your usually prescribed home medications unless otherwise directed. **Take ibuprofen instead of diclofenac for the next 4-5 days see medication list**  3. PAIN CONTROL: a. Pain is best controlled by a usual combination of three different methods TOGETHER: i. Ice/Heat ii. Over the counter pain medication iii. Prescription pain medication b. Most patients will experience some swelling and bruising around the hernia(s) such as the bellybutton, groins, or old incisions.  Ice packs or heating pads (30-60 minutes up to 6 times a day) will help. Use ice for the first few days to help decrease swelling and bruising, then switch to heat to  help relax tight/sore spots and speed recovery.  Some people prefer to use ice alone, heat alone, alternating between ice & heat.  Experiment to what works for you.  Swelling and bruising can take several weeks to resolve.   i. It is helpful to take an over-the-counter pain medication regularly for the first few days, alternate tylenol and ibuprofen as prescribed.  c. A  prescription for pain medication should be given to you upon discharge.  Take your pain medication as prescribed.  i. If you are having problems/concerns with the prescription medicine (does not control pain, nausea, vomiting, rash, itching, etc), please call us [phone] to see if we need to switch you to a different pain medicine that will work better for you and/or control your side effect better. ii. If you need a refill on your pain medication, please contact your pharmacy.  They will contact our office to request authorization. Prescriptions will not be filled after 5 pm or on week-ends.  4. Avoid getting constipated.  Between the surgery and the pain medications, it is common to experience some constipation.  Increasing fluid intake and taking a fiber supplement (such as Metamucil, Citrucel, FiberCon, MiraLax, etc) 1-2 times a day regularly will usually help prevent this problem from occurring.  A mild laxative (prune juice, Milk of Magnesia, MiraLax, etc) should be taken according to package directions if there are no bowel movements after 48 hours.    5. Wash / shower every day.  You may shower over the skin glue which is waterproof.    6. Glue will flake off after  2 weeks. You may leave the incisions open to air.  You may replace a dressing/Band-Aid to cover an incision for comfort if you wish.  Continue to shower over incision(s) after the dressing is off.  7. ACTIVITIES as tolerated:   a. You may resume regular (light) daily activities beginning the next day--such as daily self-care, walking, climbing stairs--gradually  increasing activities as tolerated.  Control your pain so that you can walk an hour a day.  If you can walk 30 minutes without difficulty, it is safe to try more intense activity such as jogging, treadmill, bicycling, low-impact aerobics, swimming, etc. b. Refrain from the most intensive and strenuous activity such as sit-ups, heavy lifting, contact sports, etc  Refrain from any heavy lifting or straining until 6 weeks after surgery.   c. DO NOT PUSH THROUGH PAIN.  Let pain be your guide: If it hurts to do something, don't do it.  Pain is your body warning you to avoid that activity for another week until the pain goes down. d. You may drive when you are no longer taking prescription pain medication, you can comfortably wear a seatbelt, and you can safely maneuver your car and apply brakes. e. Dennis Bast may have sexual intercourse when it is comfortable.   8. FOLLOW UP in our office a. Please call [facility] at [phone] to set up an appointment to see your surgeon in the office for a follow-up appointment approximately 2-3 weeks after your surgery. b. Make sure that you call for this appointment the day you arrive home to insure a convenient appointment time.  9.  If you have disability of FMLA / Family leave forms, please bring the forms to the office for processing.  (do not give to your surgeon).  WHEN TO CALL us [phone]: 1. Poor pain control 2. Reactions / problems with new medications (rash/itching, nausea, etc)  3. Fever over 101.5 F (38.5 C) 4. Inability to urinate 5. Nausea and/or vomiting 6. Worsening swelling or bruising 7. Continued bleeding from incision. 8. Increased pain, redness, or drainage from the incision   The clinic staff is available to answer your questions during regular business hours (8:30am-5pm).  Please don't hesitate to call and ask to speak to one of our nurses for clinical concerns.   If you have a medical emergency, go to the nearest emergency room or call  911.  A surgeon from [facility] is always on call at the hospitals in [facility], [place], [place], [place], [place]  [address] ?  [address] [address], [place], [place]   [address] MAIN: [phone] ? TOLL FREE: [phone] ? FAX: [phone] [email or web address]   Post Anesthesia Home Care Instructions  Activity: Get plenty of rest for the remainder of the day. A responsible individual must stay with you for 24 hours following the procedure.  For the next 24 hours, DO NOT: -Drive a car -Paediatric nurse -Drink alcoholic beverages -Take any medication unless instructed by your physician -Make any legal decisions or sign important papers.  Meals: Start with liquid foods such as gelatin or soup. Progress to regular foods as tolerated. Avoid greasy, spicy, heavy foods. If nausea and/or vomiting occur, drink only clear liquids until the nausea and/or vomiting subsides. Call your physician if vomiting continues.  Special Instructions/Symptoms: Your throat may feel dry or sore from the anesthesia or the breathing tube placed in your throat during surgery. If this causes discomfort, gargle with warm  salt water. The discomfort should disappear within 24 hours.  Information for Discharge Teaching: EXPAREL (bupivacaine liposome injectable suspension)   Your surgeon or anesthesiologist gave you EXPAREL(bupivacaine) to help control your pain after surgery.   EXPAREL is a local anesthetic that provides pain relief by numbing the tissue around the surgical site.  EXPAREL is designed to release pain medication over time and can control pain for up to 72 hours.  Depending on how you respond to EXPAREL, you may require less pain medication during your recovery.  Possible side effects:  Temporary loss of sensation or ability to move in the area where bupivacaine was injected.  Nausea, vomiting, constipation  Rarely, numbness  and tingling in your mouth or lips, lightheadedness, or anxiety may occur.  Call your doctor right away if you think you may be experiencing any of these sensations, or if you have other questions regarding possible side effects.  Follow all other discharge instructions given to you by your surgeon or nurse. Eat a healthy diet and drink plenty of water or other fluids.  If you return to the hospital for any reason within 96 hours following the administration of EXPAREL, it is important for health care providers to know that you have received this anesthetic. A teal colored band has been placed on your arm with the date, time and amount of EXPAREL you have received in order to alert and inform your health care providers. Please leave this armband in place for the full 96 hours following administration, and then you may remove the band.

## 2019-09-25 NOTE — Transfer of Care (Signed)
Immediate Anesthesia Transfer of Care Note  Patient: George Campbell  Procedure(s) Performed: XI ROBOTIC ASSISTED BILATERAL INGUINAL HERNIA REPAIR WITH MESH (Bilateral Abdomen)  Patient Location: PACU  Anesthesia Type:General  Level of Consciousness: awake, alert , oriented and patient cooperative  Airway & Oxygen Therapy: Patient Spontanous Breathing and Patient connected to face mask oxygen  Post-op Assessment: Report given to RN, Post -op Vital signs reviewed and stable and Patient moving all extremities X 4  Post vital signs: stable  Last Vitals:  Vitals Value Taken Time  BP 129/85 09/25/19 1045  Temp 36.9 C 09/25/19 1036  Pulse 80 09/25/19 1046  Resp 13 09/25/19 1046  SpO2 98 % 09/25/19 1046  Vitals shown include unvalidated device data.  Last Pain:  Vitals:   09/25/19 1036  TempSrc:   PainSc: 0-No pain      Patients Stated Pain Goal: 3 (43/73/57 [id number])  Complications: No apparent anesthesia complications

## 2019-09-25 NOTE — Op Note (Signed)
Operative Note  Jaques Mineer  [id number]  [id number]  09/25/2019   Surgeon: Clovis Riley MD   Assistant: Gurney Maxin MD   Procedure performed: Mechele Claude robotic transabdominal preperitoneal bilateral inguinal hernia repair with mesh   Preop diagnosis: Bilateral inguinal hernia Post-op diagnosis/intraop findings: Bilateral indirect inguinal hernia   Specimens: None Retained items: None  EBL: Minimal cc Complications: none   Description of procedure: After obtaining informed consent the patient was taken to the operating room and placed supine on operating room table where general endotracheal anesthesia was initiated, preoperative antibiotics were administered, SCDs applied, foley inserted and a formal timeout was performed. The abdomen was clipped, prepped and draped in usual sterile fashion. Peritoneal access was gained with periumbilical Veress needle and insufflation to 15 mmHg ensued without incident. 8 millimeter trocar and camera were inserted. The abdomen was inspected and confirmed to be free of any injury from our entry or gross abnormalities. The patient was then placed in steep Trendelenburg and the pelvis inspected.  He was noted to have bilateral indirect hernia defects.  Laparoscopic-assisted tap blocks were performed bilaterally using Exparel mixed with Marcaine.  Under direct visualization, bilateral 8 mm trochars were inserted after infiltration with local.  Beginning on the patient's right side, the peritoneal flap was developed using sharp and cautery dissection spanning from the anterior superior iliac spine to the medial umbilical ligament. The flap was bluntly dissected off the anterior abdominal wall until the hernia sac was reduced, the Cooper's ligament exposed medially and sufficient room for the mesh was created inferiorly with the femoral vessels exposed.  There appeared to be a tubular structure extending from the peritoneum posterior to the vas and femoral vessels,  this was preserved.  The vas and gonadal vessels and vas were clearly exposed.  Hemostasis was confirmed in the dissection field. A 3 x 6 ultra Pro mesh was then brought onto the field and trimmed to suit the defect. This was inserted and placed over the hernia defect where there was noted to be plenty of overlap. The mesh was secured to the Cooper's ligament and superiorly on either side of the inferior epigastric vessels using 3-0 Vicryl sutures. The peritoneal flap was then brought back up to completely cover the mesh and was re-opposed to the abdominal wall with a running 3-0 Vicryl.  We ensured that the mesh remained flush against the anterior abdominal wall and did not buckle or fold while doing this.  A tear in the peritoneum was repaired with a figure-of-eight 3-0 Vicryl.  At completion there was no exposed mesh present. Hemostasis was confirmed.  We then turned to the left side where in a similar fashion the peritoneal flap was developed from the anterior superior iliac spine to the medial umbilical ligament and blunt and cautery dissection were used to continue dissecting the flap until the hernia sac was reduced, the gonadal vessels, vasa and femoral vessels exposed, and the Cooper's ligament well exposed.  A 3 x 6 ultra Pro mesh again was trimmed and inserted to live flush over the hernia defect with plenty of overlap.  This was secured to the Cooper's ligament as well as superiorly on either side of the inferior epigastric vessels with interrupted 3-0 Vicryl sutures.  The peritoneal flap was brought back up to the abdominal wall, covering the mesh while ensuring that the mesh did not buckle or fold in a running 3-0 Vicryl was used to close the peritoneum.  On final inspection, both sides were hemostatic  and mesh completely covered with peritoneum.  At this point the robot was undocked, the abdomen desufflated and trochars removed.  Skin incisions were closed with subcuticular Monocryl and Dermabond.   The skin incisions were closed with subcuticular Monocryl and Dermabond. The patient was then awakened, extubated and taken to PACU in stable condition.  All counts were correct at the completion of the case.

## 2019-09-25 NOTE — Anesthesia Procedure Notes (Signed)
Procedure Name: Intubation Date/Time: 09/25/2019 7:55 AM Performed by: Lissa Morales, CRNA Pre-anesthesia Checklist: Patient identified, Emergency Drugs available, Suction available and Patient being monitored Patient Re-evaluated:Patient Re-evaluated prior to induction Oxygen Delivery Method: Circle system utilized Preoxygenation: Pre-oxygenation with 100% oxygen Induction Type: IV induction Ventilation: Mask ventilation without difficulty Laryngoscope Size: Mac and 4 Grade View: Grade I Tube type: Oral Tube size: 7.5 mm Number of attempts: 1 Airway Equipment and Method: Stylet and Oral airway Placement Confirmation: ETT inserted through vocal cords under direct vision,  positive ETCO2 and breath sounds checked- equal and bilateral Secured at: 21 cm Tube secured with: Tape Dental Injury: Teeth and Oropharynx as per pre-operative assessment

## 2019-09-25 NOTE — Interval H&P Note (Signed)
History and Physical Interval Note:  09/25/2019 7:11 AM  George Campbell  has presented today for surgery, with the diagnosis of INGUINAL HERNIAS.  The various methods of treatment have been discussed with the patient and family. After consideration of risks, benefits and other options for treatment, the patient has consented to  Procedure(s): XI ROBOTIC ASSISTED RIGHT, POSSIBLE BILATERAL INGUINAL HERNIA REPAIR WITH MESH (Bilateral) as a surgical intervention.  The patient's history has been reviewed, patient examined, no change in status, stable for surgery.  I have reviewed the patient's chart and labs.  Questions were answered to the patient's satisfaction.     Alenah Sarria Rich Brave

## 2019-09-25 NOTE — Anesthesia Postprocedure Evaluation (Signed)
Anesthesia Post Note  Patient: George Campbell  Procedure(s) Performed: XI ROBOTIC ASSISTED BILATERAL INGUINAL HERNIA REPAIR WITH MESH (Bilateral Abdomen)     Patient location during evaluation: PACU Anesthesia Type: General Level of consciousness: awake and alert Pain management: pain level controlled Vital Signs Assessment: post-procedure vital signs reviewed and stable Respiratory status: spontaneous breathing, nonlabored ventilation, respiratory function stable and patient connected to nasal cannula oxygen Cardiovascular status: blood pressure returned to baseline and stable Postop Assessment: no apparent nausea or vomiting Anesthetic complications: no    Last Vitals:  Vitals:   09/25/19 1100 09/25/19 1155  BP: 135/85 131/77  Pulse: 82 78  Resp: 13 14  Temp:  37 C  SpO2: 96% 97%    Last Pain:  Vitals:   09/25/19 1155  TempSrc:   PainSc: 4                  Barnet Glasgow

## 2019-09-26 ENCOUNTER — Encounter: Payer: Self-pay | Admitting: Family Medicine

## 2019-10-04 ENCOUNTER — Other Ambulatory Visit: Payer: Self-pay | Admitting: Family Medicine

## 2019-10-04 MED ORDER — AMPHETAMINE-DEXTROAMPHET ER 30 MG PO CP24: 30.0000 mg | ORAL_CAPSULE | Freq: Every day | ORAL | 0 refills | Status: DC

## 2019-10-04 MED ORDER — AMPHETAMINE-DEXTROAMPHETAMINE 10 MG PO TABS: 10.0000 mg | ORAL_TABLET | Freq: Every day | ORAL | 0 refills | Status: DC | PRN

## 2019-10-04 NOTE — Telephone Encounter (Signed)
Rx request 

## 2019-10-09 ENCOUNTER — Encounter: Payer: Self-pay | Admitting: Family Medicine

## 2019-10-09 ENCOUNTER — Ambulatory Visit (INDEPENDENT_AMBULATORY_CARE_PROVIDER_SITE_OTHER): Payer: BC Managed Care – PPO | Admitting: Family Medicine

## 2019-10-09 ENCOUNTER — Other Ambulatory Visit: Payer: Self-pay

## 2019-10-09 VITALS — BP 124/78 | HR 94 | Temp 97.7°F | Ht 69.0 in | Wt 242.4 lb

## 2019-10-09 DIAGNOSIS — R7301 Impaired fasting glucose: Secondary | ICD-10-CM | POA: Diagnosis not present

## 2019-10-09 DIAGNOSIS — F988 Other specified behavioral and emotional disorders with onset usually occurring in childhood and adolescence: Secondary | ICD-10-CM | POA: Diagnosis not present

## 2019-10-09 DIAGNOSIS — Z0000 Encounter for general adult medical examination without abnormal findings: Secondary | ICD-10-CM

## 2019-10-09 DIAGNOSIS — I10 Essential (primary) hypertension: Secondary | ICD-10-CM | POA: Diagnosis not present

## 2019-10-09 DIAGNOSIS — F1021 Alcohol dependence, in remission: Secondary | ICD-10-CM

## 2019-10-09 DIAGNOSIS — K700 Alcoholic fatty liver: Secondary | ICD-10-CM

## 2019-10-09 LAB — CBC WITH DIFFERENTIAL/PLATELET
Basophils Absolute: 0.1 10*3/uL (ref 0.0–0.1)
Basophils Relative: 0.5 % (ref 0.0–3.0)
Eosinophils Absolute: 0.5 10*3/uL (ref 0.0–0.7)
Eosinophils Relative: 3.3 % (ref 0.0–5.0)
HCT: 44.1 % (ref 39.0–52.0)
Hemoglobin: 15.1 g/dL (ref 13.0–17.0)
Lymphocytes Relative: 19 % (ref 12.0–46.0)
Lymphs Abs: 2.8 10*3/uL (ref 0.7–4.0)
MCHC: 34.3 g/dL (ref 30.0–36.0)
MCV: 84.5 fl (ref 78.0–100.0)
Monocytes Absolute: 1.8 10*3/uL — ABNORMAL HIGH (ref 0.1–1.0)
Monocytes Relative: 11.9 % (ref 3.0–12.0)
Neutro Abs: 9.8 10*3/uL — ABNORMAL HIGH (ref 1.4–7.7)
Neutrophils Relative %: 65.3 % (ref 43.0–77.0)
Platelets: 305 10*3/uL (ref 150.0–400.0)
RBC: 5.22 Mil/uL (ref 4.22–5.81)
RDW: 13.6 % (ref 11.5–15.5)
WBC: 15 10*3/uL — ABNORMAL HIGH (ref 4.0–10.5)

## 2019-10-09 LAB — COMPREHENSIVE METABOLIC PANEL
ALT: 27 U/L (ref 0–53)
AST: 24 U/L (ref 0–37)
Albumin: 4.7 g/dL (ref 3.5–5.2)
Alkaline Phosphatase: 81 U/L (ref 39–117)
BUN: 15 mg/dL (ref 6–23)
CO2: 29 mEq/L (ref 19–32)
Calcium: 10 mg/dL (ref 8.4–10.5)
Chloride: 100 mEq/L (ref 96–112)
Creatinine, Ser: 0.71 mg/dL (ref 0.40–1.50)
GFR: 118.94 mL/min (ref >60.00)
Glucose, Bld: 130 mg/dL — ABNORMAL HIGH (ref 70–99)
Potassium: 4.5 mEq/L (ref 3.5–5.1)
Sodium: 137 mEq/L (ref 135–145)
Total Bilirubin: 0.8 mg/dL (ref 0.2–1.2)
Total Protein: 7.8 g/dL (ref 6.0–8.3)

## 2019-10-09 LAB — POCT URINALYSIS DIPSTICK
Bilirubin, UA: NEGATIVE
Blood, UA: NEGATIVE
Glucose, UA: NEGATIVE
Ketones, UA: NEGATIVE
Leukocytes, UA: NEGATIVE
Nitrite, UA: NEGATIVE
Protein, UA: NEGATIVE
Spec Grav, UA: 1.02 (ref 1.010–1.025)
Urobilinogen, UA: 0.2 E.U./dL
pH, UA: 7 (ref 5.0–8.0)

## 2019-10-09 LAB — LIPID PANEL
Cholesterol: 137 mg/dL (ref 0–200)
HDL: 43.2 mg/dL (ref >39.00)
LDL Cholesterol: 63 mg/dL (ref 0–99)
NonHDL: 93.3
Total CHOL/HDL Ratio: 3
Triglycerides: 152 mg/dL — ABNORMAL HIGH (ref 0.0–149.0)
VLDL: 30.4 mg/dL (ref 0.0–40.0)

## 2019-10-09 LAB — TSH: TSH: 1.7 u[IU]/mL (ref 0.35–4.50)

## 2019-10-09 LAB — HEMOGLOBIN A1C: Hgb A1c MFr Bld: 6.7 % — ABNORMAL HIGH (ref 4.6–6.5)

## 2019-10-09 MED ORDER — AMPHETAMINE-DEXTROAMPHETAMINE 10 MG PO TABS: 10.0000 mg | ORAL_TABLET | Freq: Every day | ORAL | 0 refills | Status: DC | PRN

## 2019-10-09 MED ORDER — AMPHETAMINE-DEXTROAMPHET ER 30 MG PO CP24: 30.0000 mg | ORAL_CAPSULE | Freq: Every day | ORAL | 0 refills | Status: DC

## 2019-10-09 MED ORDER — LISINOPRIL-HYDROCHLOROTHIAZIDE 20-12.5 MG PO TABS: 2.0000 | ORAL_TABLET | Freq: Every day | ORAL | 3 refills | Status: DC

## 2019-10-09 MED ORDER — AMLODIPINE BESYLATE 10 MG PO TABS: ORAL_TABLET | ORAL | 3 refills | Status: DC

## 2019-10-09 NOTE — Patient Instructions (Addendum)
Please return in 3 months for ADD follow up.   I will release your lab results to you on your MyChart account with further instructions. Please reply with any questions.  I have refilled your medications. I have printed RXs for your adderall.  Glad you are doing well!  If you have any questions or concerns, please don't hesitate to send me a message via MyChart or call the office at [phone]. Thank you for visiting with [place] today! It's our pleasure caring for you.  Please do these things to maintain good health!   Exercise at least 30-45 minutes a day,  4-5 days a week.   Eat a low-fat diet with lots of fruits and vegetables, up to 7-9 servings per day.  Drink plenty of water daily. Try to drink 8 8oz glasses per day.  Seatbelts can save your life. Always wear your seatbelt.  Place Smoke Detectors on every level of your home and check batteries every year.  Eye Doctor - have an eye exam every 1-2 years  Safe sex - use condoms to protect yourself from STDs if you could be exposed to these types of infections.  Avoid heavy alcohol use. If you drink, keep it to less than 2 drinks/day and not every day.  [place].  Choose someone you trust that could speak for you if you became unable to speak for yourself.  Depression is common in our stressful world.If you're feeling down or losing interest in things you normally enjoy, please come in for a visit.

## 2019-10-09 NOTE — Progress Notes (Signed)
Subjective  Chief Complaint  Patient presents with  . Annual Exam    fasting. no new concerns  . Hypertension    Diest is stable. Does not exercise. No HA, dizziness, or visual changes    HPI: George Campbell is a 49 y.o. male who presents to [place] [facility] [place] today for a Male Wellness Visit. He also has the concerns and/or needs as listed above in the chief complaint. These will be addressed in addition to the Health Maintenance Visit.   Wellness Visit: annual visit with health maintenance review and exam    HM: fasting for labs today. Feels well. Trying to eat better. Still eats poorly in part to hectic work schedule and convenience foods. imms up to date Lifestyle: Body mass index is 35.8 kg/m. Wt Readings from Last 3 Encounters:  10/09/19 242 lb 6.4 oz (110 kg)  09/25/19 239 lb 1.6 oz (108.5 kg)  08/14/19 241 lb (109.3 kg)    Chronic disease management visit and/or acute problem visit:  ADD:I reviewed patient's records from the PMP aware controlled substance registry today.  adderall use is appropriate and helpful. No new concerns. Needs for work. Has long hours  HTN f/u: Feeling well. Taking medications w/o adverse effects. No symptoms of CHF, angina; no palpitations, sob, cp or lower extremity edema. Compliant with meds.   IFG: reports had elevated fastings while at [facility]. No sxs of hyperglycemia  Fatty liver: had normalized since quit drinking  Alcoholism in recovery: in recovery x 6 months. On naltrexone injections; very helpful. Seeing therapist at [facility] and still attends [employer]. Very happy about progress  S/p inguinal repair with mesh: doing well. Sees surgeon for f/u next week. S/p bilateral repair.  BP Readings from Last 3 Encounters:  10/09/19 124/78  09/25/19 131/77  08/14/19 (!) 142/84   Lab Results  Component Value Date   CHOL 146 09/19/2018   HDL 54.70 09/19/2018   LDLCALC 64 09/19/2018   TRIG 135.0 09/19/2018   CHOLHDL 3 09/19/2018     Patient Active Problem List   Diagnosis Date Noted  . Alcoholism in recovery ([place]) 11/24/2017  . Fatty liver, alcoholic 30/16/0109  . IFG (impaired fasting glucose) 03/25/2015  . Class 1 obesity due to excess calories with serious comorbidity and body mass index (BMI) of 34.0 to 34.9 in adult 08/02/2013  . Essential hypertension with goal blood pressure less than 140/90 08/02/2013  . Attention deficit disorder (ADD) without hyperactivity 04/17/2013   Health Maintenance  Topic Date Due  . TETANUS/TDAP  06/09/2029  . INFLUENZA VACCINE  Completed  . HIV Screening  Completed   Immunization History  Administered Date(s) Administered  . Hepatitis B, adult 06/21/2018, 07/24/2018  . Influenza Inj Mdck Quad Pf 06/10/2017, 06/04/2019  . Influenza Nasal 05/15/2013, 05/27/2014  . Influenza, Quadrivalent, Recombinant, Inj, Pf 06/10/2017  . Influenza, Seasonal, Injecte, Preservative Fre 06/02/2015, 05/31/2016  . Influenza,inj,Quad PF,6+ Mos 06/21/2018  . Influenza-Unspecified 06/21/2018, 06/04/2019  . Pneumococcal Polysaccharide-23 09/14/2016  . Tdap 09/06/2014, 06/10/2019   We updated and reviewed the patient's past history in detail and it is documented below. Allergies: Patient is allergic to strattera [atomoxetine hcl]. Past Medical History  has a past medical history of Anxiety, Depression, Hypertension, and Inguinal hernia. Past Surgical History Patient  has a past surgical history that includes Knee arthrodesis (Right, 2002); Mouth surgery; Hernia repair; and XI Robotic assisted inguinal hernia repair with mesh (Bilateral, 09/25/2019). Social History Patient  reports that he quit  smoking about 3 years ago. His smoking use included cigarettes. He has a 45.00 pack-year smoking history. He has never used smokeless tobacco. He reports previous alcohol use. He reports current drug use. Drug: Marijuana. Family History family history includes Esophageal cancer in  his maternal uncle; Healthy in his paternal uncle and son; Hypertension in his mother. Review of Systems: Constitutional: negative for fever or malaise Ophthalmic: negative for photophobia, double vision or loss of vision Cardiovascular: negative for chest pain, dyspnea on exertion, or new LE swelling Respiratory: negative for SOB or persistent cough Gastrointestinal: negative for abdominal pain, change in bowel habits or melena Genitourinary: negative for dysuria or gross hematuria Musculoskeletal: negative for new gait disturbance or muscular weakness Integumentary: negative for new or persistent rashes Neurological: negative for TIA or stroke symptoms Psychiatric: negative for SI or delusions Allergic/Immunologic: negative for hives  Patient Care Team    Relationship Specialty Notifications Start End  Leamon Arnt, MD PCP - General Family Medicine  09/26/17   Clovis Riley, MD Consulting Physician General Surgery  09/26/19    Objective  Vitals: BP 124/78 (BP Location: Right Arm, Patient Position: Sitting, Cuff Size: Large)   Pulse 94   Temp 97.7 F (36.5 C) (Temporal)   Ht 5' 9"  (1.753 m)   Wt 242 lb 6.4 oz (110 kg)   SpO2 97%   BMI 35.80 kg/m  General:  Well developed, well nourished, no acute distress  Psych:  Alert and orientedx3,normal mood and affect HEENT:  Normocephalic, atraumatic, non-icteric sclera, PERRL, oropharynx is clear without mass or exudate, supple neck without adenopathy, mass or thyromegaly Cardiovascular:  Normal S1, S2, RRR without gallop, rub or murmur, nondisplaced PMI, +2 distal pulses in bilateral upper and lower extremities. Respiratory:  Good breath sounds bilaterally, CTAB with normal respiratory effort Gastrointestinal: hypotactivebowel sounds, soft, non-tender except at incisions:clean and dry, no noted masses. No HSM MSK: no deformities, contusions. Joints are without erythema or swelling. Spine and CVA region are nontender Skin:  Warm, no  rashes or suspicious lesions noted Neurologic:    Mental status is normal. CN 2-11 are normal. Gross motor and sensory exams are normal. Stable gait. No tremor GU: No inguinal hernias or adenopathy are appreciated bilaterally   Assessment  1. Annual physical exam   2. Essential hypertension with goal blood pressure less than 140/90   3. IFG (impaired fasting glucose)   4. Attention deficit disorder (ADD) without hyperactivity   5. Fatty liver, alcoholic   6. Alcoholism in recovery [facility])      Plan  Male Wellness Visit:  Age appropriate Health Maintenance and Prevention measures were discussed with patient. Included topics are cancer screening recommendations, ways to keep healthy (see AVS) including dietary and exercise recommendations, regular eye and dental care, use of seat belts, and avoidance of moderate alcohol use and tobacco use.   BMI: discussed patient's BMI and encouraged positive lifestyle modifications to help get to or maintain a target BMI.  HM needs and immunizations were addressed and ordered. See below for orders. See HM and immunization section for updates.  Routine labs and screening tests ordered including cmp, cbc and lipids where appropriate.  Discussed recommendations regarding Vit D and calcium supplementation (see AVS)  Chronic disease f/u and/or acute problem visit: (deemed necessary to be done in addition to the wellness visit):  ADD: well controlled. Refilled meds x 3 months. 2 printed RXs given.   Alcoholism in recovery: continue recovery; praised. Encouraged  IFG and fatty  liver: recheck labs. Discussed low fat, low processed foods. Needs improvement to prevent future problems.  HTN is well controlled. Refilled meds. Check lytes and renal fxn.   Follow up: Return in about 3 months (around 01/06/2020) for follow up on ADD.   Commons side effects, risks, benefits, and alternatives for medications and treatment plan prescribed today were discussed, and  the patient expressed understanding of the given instructions. Patient is instructed to call or message via MyChart if he/she has any questions or concerns regarding our treatment plan. No barriers to understanding were identified. We discussed Red Flag symptoms and signs in detail. Patient expressed understanding regarding what to do in case of urgent or emergency type symptoms.   Medication list was reconciled, printed and provided to the patient in AVS. Patient instructions and summary information was reviewed with the patient as documented in the AVS. This note was prepared with assistance of Dragon voice recognition software. Occasional wrong-word or sound-a-like substitutions may have occurred due to the inherent limitations of voice recognition software  This visit occurred during the SARS-CoV-2 public health emergency.  Safety protocols were in place, including screening questions prior to the visit, additional usage of staff PPE, and extensive cleaning of exam room while observing appropriate contact time as indicated for disinfecting solutions.   Orders Placed This Encounter  Procedures  . CBC with Differential/Platelet  . Comprehensive metabolic panel  . Lipid panel  . TSH  . Hemoglobin A1c  . POCT urinalysis dipstick   Meds ordered this encounter  Medications  . DISCONTD: amphetamine-dextroamphetamine (ADDERALL XR) 30 MG 24 hr capsule    Sig: Take 1 capsule (30 mg total) by mouth daily.    Dispense:  30 capsule    Refill:  0  . DISCONTD: amphetamine-dextroamphetamine (ADDERALL) 10 MG tablet    Sig: Take 1 tablet (10 mg total) by mouth daily as needed.    Dispense:  30 tablet    Refill:  0  . amLODipine (NORVASC) 10 MG tablet    Sig: TAKE 1 TABLET(10 MG) BY MOUTH DAILY    Dispense:  90 tablet    Refill:  3  . lisinopril-hydrochlorothiazide (ZESTORETIC) 20-12.5 MG tablet    Sig: Take 2 tablets by mouth daily.    Dispense:  180 tablet    Refill:  3  . DISCONTD:  amphetamine-dextroamphetamine (ADDERALL XR) 30 MG 24 hr capsule    Sig: Take 1 capsule (30 mg total) by mouth daily.    Dispense:  30 capsule    Refill:  0  . DISCONTD: amphetamine-dextroamphetamine (ADDERALL) 10 MG tablet    Sig: Take 1 tablet (10 mg total) by mouth daily as needed.    Dispense:  30 tablet    Refill:  0  . amphetamine-dextroamphetamine (ADDERALL XR) 30 MG 24 hr capsule    Sig: Take 1 capsule (30 mg total) by mouth daily.    Dispense:  30 capsule    Refill:  0  . amphetamine-dextroamphetamine (ADDERALL) 10 MG tablet    Sig: Take 1 tablet (10 mg total) by mouth daily as needed.    Dispense:  30 tablet    Refill:  0

## 2019-10-13 ENCOUNTER — Other Ambulatory Visit (HOSPITAL_COMMUNITY): Payer: BC Managed Care – PPO

## 2019-11-03 ENCOUNTER — Encounter: Payer: Self-pay | Admitting: Family Medicine

## 2019-11-05 ENCOUNTER — Other Ambulatory Visit: Payer: Self-pay

## 2019-11-05 ENCOUNTER — Telehealth: Payer: Self-pay | Admitting: Family Medicine

## 2019-11-05 ENCOUNTER — Other Ambulatory Visit: Payer: Self-pay | Admitting: Family Medicine

## 2019-11-05 MED ORDER — AMPHETAMINE-DEXTROAMPHET ER 30 MG PO CP24: 30.0000 mg | ORAL_CAPSULE | Freq: Every day | ORAL | 0 refills | Status: DC

## 2019-11-05 NOTE — Telephone Encounter (Signed)
I saw him last month at his physical and printed RX for march and April: can you call him to see if he has them. He should.  If he doesn't, then I will have to send them in.

## 2019-11-05 NOTE — Telephone Encounter (Signed)
MEDICATION: ADDERALL XR 30 MG 24 HR CAPSULE  LAST APPOINTMENT DATE: 10/09/2019 NEXT APPOINTMENT DATE:@5 /13/2021   LAST REFILL: 10/09/2019  QTY: 30 Capsule

## 2019-11-05 NOTE — Addendum Note (Signed)
Addended by: Billey Chang on: 11/05/2019 04:57 PM   Modules accepted: Orders

## 2019-11-05 NOTE — Telephone Encounter (Signed)
Patient states that Adderall 10 MG was sent to the pharmacy for March, which he already picked up. However, Adderall 30 mg has not been sent to the pharmacy for March. He still has printed scripts for April and May.

## 2019-11-05 NOTE — Telephone Encounter (Signed)
Request sent to Dr. Jonni Sanger

## 2019-11-05 NOTE — Telephone Encounter (Signed)
MEDICATION:amphetamine-dextroamphetamine (ADDERALL XR) 30 MG 24 hr capsule  PHARMACY:  [id number] - [place], [place] - [address] AT [address] Phone:  [phone]  Fax:  [phone]       Comments:   **Let patient know to contact pharmacy at the end of the day to make sure medication is ready. **  ** Please notify patient to allow 48-72 hours to process**  **Encourage patient to contact the pharmacy for refills or they can request refills through [facility]**

## 2019-11-06 NOTE — Telephone Encounter (Signed)
Pt notified via MyChart message that Adderall 30 mg has been sent to his pharmacy.

## 2019-11-06 NOTE — Telephone Encounter (Signed)
Called patient to notify him that I will reach out to Dr. Jonni Sanger.

## 2019-11-28 ENCOUNTER — Encounter: Payer: Self-pay | Admitting: Family Medicine

## 2019-11-28 ENCOUNTER — Other Ambulatory Visit: Payer: Self-pay

## 2019-11-28 MED ORDER — LISINOPRIL-HYDROCHLOROTHIAZIDE 20-12.5 MG PO TABS: 2.0000 | ORAL_TABLET | Freq: Every day | ORAL | 3 refills | Status: DC

## 2020-01-02 ENCOUNTER — Encounter: Payer: Self-pay | Admitting: Family Medicine

## 2020-01-02 NOTE — Telephone Encounter (Signed)
Patient appt has been schedule as a virtual appt.

## 2020-01-10 ENCOUNTER — Encounter: Payer: Self-pay | Admitting: Family Medicine

## 2020-01-10 ENCOUNTER — Ambulatory Visit: Payer: BC Managed Care – PPO | Admitting: Family Medicine

## 2020-01-10 ENCOUNTER — Telehealth (INDEPENDENT_AMBULATORY_CARE_PROVIDER_SITE_OTHER): Payer: BC Managed Care – PPO | Admitting: Family Medicine

## 2020-01-10 DIAGNOSIS — F988 Other specified behavioral and emotional disorders with onset usually occurring in childhood and adolescence: Secondary | ICD-10-CM | POA: Diagnosis not present

## 2020-01-10 DIAGNOSIS — F1021 Alcohol dependence, in remission: Secondary | ICD-10-CM

## 2020-01-10 MED ORDER — AMPHETAMINE-DEXTROAMPHETAMINE 10 MG PO TABS: 10.0000 mg | ORAL_TABLET | Freq: Every day | ORAL | 0 refills | Status: DC | PRN

## 2020-01-10 MED ORDER — AMPHETAMINE-DEXTROAMPHET ER 30 MG PO CP24: 30.0000 mg | ORAL_CAPSULE | Freq: Every day | ORAL | 0 refills | Status: DC

## 2020-01-10 MED ORDER — NALTREXONE HCL 50 MG PO TABS: 50.0000 mg | ORAL_TABLET | Freq: Every day | ORAL | 1 refills | Status: DC

## 2020-01-10 NOTE — Progress Notes (Signed)
Virtual Visit via Video Note  Subjective  CC:  Chief Complaint  Patient presents with  . ADD    patient states that he feels ADD is well controlled with current medication regimen     I connected with Tera Partridge on 01/10/20 at 10:00 AM EDT by a video enabled telemedicine application and verified that I am speaking with the correct person using two identifiers. Location patient: Home Location provider: [facility] [place], Office Persons participating in the virtual visit: Hilary Pundt, Leamon Arnt, MD Reymundo Poll CMA  I discussed the limitations of evaluation and management by telemedicine and the availability of in person appointments. The patient expressed understanding and agreed to proceed. HPI: George Campbell is a 49 y.o. male who was contacted today to address the problems listed above in the chief complaint. . 49 yo 9 months sober today on naltrexone IM monthly now switching to PO version and asking that I take over prescribing and managing this for him. He is stable. Has completed treatment and feels the medication helps him manage alcohol cravings. He physically feels better and home life is now good again.  . ADD f/u: very well controlled w/o concerns. Needs refills. Of note, his 49 yo was just diagnosed with ADD and is doing much better on meds.   Assessment  1. Attention deficit disorder (ADD) without hyperactivity   2. Alcoholism in recovery [facility])      Plan   ADD:  Well controlled. I reviewed patient's records from the PMP aware controlled substance registry today.   Alcoholism in recovery on naltrexone. Now stable enough for PO formulation. Ordered.  I discussed the assessment and treatment plan with the patient. The patient was provided an opportunity to ask questions and all were answered. The patient agreed with the plan and demonstrated an understanding of the instructions.   The patient was advised to call back or seek an in-person  evaluation if the symptoms worsen or if the condition fails to improve as anticipated. Follow up: recheck 3 months for HTN, ADD and etoh recovery  Visit date not found  Meds ordered this encounter  Medications  . naltrexone (DEPADE) 50 MG tablet    Sig: Take 1 tablet (50 mg total) by mouth daily.    Dispense:  90 tablet    Refill:  1    Keep on file for refill due next month  . DISCONTD: amphetamine-dextroamphetamine (ADDERALL XR) 30 MG 24 hr capsule    Sig: Take 1 capsule (30 mg total) by mouth daily.    Dispense:  30 capsule    Refill:  0    Please keep on file for next refill due June  . DISCONTD: amphetamine-dextroamphetamine (ADDERALL) 10 MG tablet    Sig: Take 1 tablet (10 mg total) by mouth daily as needed.    Dispense:  30 tablet    Refill:  0    Please keep on file for next refill due June  . DISCONTD: amphetamine-dextroamphetamine (ADDERALL XR) 30 MG 24 hr capsule    Sig: Take 1 capsule (30 mg total) by mouth daily.    Dispense:  30 capsule    Refill:  0    Please keep on file for next refill due July  . DISCONTD: amphetamine-dextroamphetamine (ADDERALL) 10 MG tablet    Sig: Take 1 tablet (10 mg total) by mouth daily as needed.    Dispense:  30 tablet    Refill:  0  Please keep on file for next refill due July  . amphetamine-dextroamphetamine (ADDERALL XR) 30 MG 24 hr capsule    Sig: Take 1 capsule (30 mg total) by mouth daily.    Dispense:  30 capsule    Refill:  0    Please keep on file for next refill due August  . amphetamine-dextroamphetamine (ADDERALL) 10 MG tablet    Sig: Take 1 tablet (10 mg total) by mouth daily as needed.    Dispense:  30 tablet    Refill:  0    Please keep on file for next refill due August      I reviewed the patients updated PMH, FH, and SocHx.    Patient Active Problem List   Diagnosis Date Noted  . Alcoholism in recovery ([place]) 11/24/2017  . Fatty liver, alcoholic 97/94/8016  . IFG (impaired fasting glucose) 03/25/2015  .  Class 1 obesity due to excess calories with serious comorbidity and body mass index (BMI) of 34.0 to 34.9 in adult 08/02/2013  . Essential hypertension with goal blood pressure less than 140/90 08/02/2013  . Attention deficit disorder (ADD) without hyperactivity 04/17/2013   Current Meds  Medication Sig  . amLODipine (NORVASC) 10 MG tablet TAKE 1 TABLET(10 MG) BY MOUTH DAILY  . [START ON 04/03/2020] amphetamine-dextroamphetamine (ADDERALL XR) 30 MG 24 hr capsule Take 1 capsule (30 mg total) by mouth daily.  Derrill Memo ON 04/03/2020] amphetamine-dextroamphetamine (ADDERALL) 10 MG tablet Take 1 tablet (10 mg total) by mouth daily as needed.  [name] lisinopril-hydrochlorothiazide (ZESTORETIC) 20-12.5 MG tablet Take 2 tablets by mouth daily.  . Naltrexone (VIVITROL) 380 MG SUSR Inject 380 mg into the muscle every 30 (thirty) days.  . [DISCONTINUED] amphetamine-dextroamphetamine (ADDERALL XR) 30 MG 24 hr capsule Take 1 capsule (30 mg total) by mouth daily.  . [DISCONTINUED] amphetamine-dextroamphetamine (ADDERALL XR) 30 MG 24 hr capsule Take 1 capsule (30 mg total) by mouth daily.  . [DISCONTINUED] amphetamine-dextroamphetamine (ADDERALL XR) 30 MG 24 hr capsule Take 1 capsule (30 mg total) by mouth daily.  . [DISCONTINUED] amphetamine-dextroamphetamine (ADDERALL) 10 MG tablet Take 1 tablet (10 mg total) by mouth daily as needed.  . [DISCONTINUED] amphetamine-dextroamphetamine (ADDERALL) 10 MG tablet Take 1 tablet (10 mg total) by mouth daily as needed.  . [DISCONTINUED] amphetamine-dextroamphetamine (ADDERALL) 10 MG tablet Take 1 tablet (10 mg total) by mouth daily as needed.    Allergies: Patient is allergic to strattera [atomoxetine hcl]. Family History: Patient family history includes Esophageal cancer in his maternal uncle; Healthy in his paternal uncle and son; Hypertension in his mother. Social History:  Patient  reports that he quit smoking about 4 years ago. His smoking use included cigarettes. He  has a 45.00 pack-year smoking history. He has never used smokeless tobacco. He reports previous alcohol use. He reports current drug use. Drug: Marijuana.  Review of Systems: Constitutional: Negative for fever malaise or anorexia Cardiovascular: negative for chest pain Respiratory: negative for SOB or persistent cough Gastrointestinal: negative for abdominal pain  OBJECTIVE Vitals: There were no vitals taken for this visit. General: no acute distress , A&Ox3, looks great  Leamon Arnt, MD

## 2020-05-02 ENCOUNTER — Encounter: Payer: Self-pay | Admitting: Family Medicine

## 2020-05-02 ENCOUNTER — Other Ambulatory Visit: Payer: Self-pay

## 2020-05-02 ENCOUNTER — Ambulatory Visit (INDEPENDENT_AMBULATORY_CARE_PROVIDER_SITE_OTHER): Payer: BC Managed Care – PPO | Admitting: Family Medicine

## 2020-05-02 VITALS — BP 128/88 | HR 84 | Temp 98.3°F | Resp 18 | Ht 70.0 in | Wt 256.6 lb

## 2020-05-02 DIAGNOSIS — I10 Essential (primary) hypertension: Secondary | ICD-10-CM

## 2020-05-02 DIAGNOSIS — Z23 Encounter for immunization: Secondary | ICD-10-CM | POA: Diagnosis not present

## 2020-05-02 DIAGNOSIS — F988 Other specified behavioral and emotional disorders with onset usually occurring in childhood and adolescence: Secondary | ICD-10-CM

## 2020-05-02 DIAGNOSIS — F1021 Alcohol dependence, in remission: Secondary | ICD-10-CM | POA: Diagnosis not present

## 2020-05-02 DIAGNOSIS — R7301 Impaired fasting glucose: Secondary | ICD-10-CM

## 2020-05-02 LAB — POCT GLYCOSYLATED HEMOGLOBIN (HGB A1C): Hemoglobin A1C: 6.2 % — AB (ref 4.0–5.6)

## 2020-05-02 MED ORDER — AMPHETAMINE-DEXTROAMPHET ER 30 MG PO CP24: 30.0000 mg | ORAL_CAPSULE | Freq: Every day | ORAL | 0 refills | Status: DC

## 2020-05-02 MED ORDER — AMPHETAMINE-DEXTROAMPHET ER 30 MG PO CP24
30.0000 mg | ORAL_CAPSULE | Freq: Every day | ORAL | 0 refills | Status: DC
Start: 2020-05-02 — End: 2020-07-28

## 2020-05-02 MED ORDER — AMPHETAMINE-DEXTROAMPHETAMINE 10 MG PO TABS: 10.0000 mg | ORAL_TABLET | Freq: Every day | ORAL | 0 refills | Status: DC | PRN

## 2020-05-02 MED ORDER — AMPHETAMINE-DEXTROAMPHETAMINE 10 MG PO TABS
10.0000 mg | ORAL_TABLET | Freq: Every day | ORAL | 0 refills | Status: DC | PRN
Start: 2020-06-01 — End: 2020-07-28

## 2020-05-02 MED ORDER — NALTREXONE HCL 50 MG PO TABS: 50.0000 mg | ORAL_TABLET | Freq: Every day | ORAL | 0 refills | Status: DC

## 2020-05-02 MED ORDER — AMPHETAMINE-DEXTROAMPHETAMINE 10 MG PO TABS
10.0000 mg | ORAL_TABLET | Freq: Every day | ORAL | 0 refills | Status: DC | PRN
Start: 2020-07-01 — End: 2020-09-01

## 2020-05-02 NOTE — Patient Instructions (Signed)
Please return in 3-4 months for cpe and blood pressure/sugar follow up.   CONGRATULATIONS!!!!!  Eat well and work on losing a little weight to help your sugars.   If you have any questions or concerns, please don't hesitate to send me a message via MyChart or call the office at [phone]. Thank you for visiting with [place] today! It's our pleasure caring for you.

## 2020-05-02 NOTE — Progress Notes (Signed)
Subjective  CC:  Chief Complaint  Patient presents with  . Hypertension    Tolerating medication well.   . ADHD    Refills pending  . Health Maintenance    Getting his flu shot today in office.    HPI: George Campbell is a 49 y.o. male who presents to the office today for follow up of diabetes and problems listed above in the chief complaint.   HTN fu: doing well w/o concerns. Feeling well. Taking medications w/o adverse effects. No symptoms of CHF, angina; no palpitations, sob, cp or lower extremity edema. Compliant with meds.   ADD: continues to be very stable on meds. I reviewed patient's records from the PMP aware controlled substance registry today.   Reached 1 year of sobriety!!!!! Doing very well. On naltrexone but no longer having cravings. Attends AA. Feels happy. Would consider weaning off meds.   Diabetes follow up: His diabetic control is reported as Improved. a1c was up to 6.7 in feb. He eats more now that he is not drinking. Weight is up. No sxs of hyperglycemia.  He denies exertional CP or SOB or symptomatic hypoglycemia. He denies foot sores or paresthesias.   Wt Readings from Last 3 Encounters:  05/02/20 256 lb 9.6 oz (116.4 kg)  10/09/19 242 lb 6.4 oz (110 kg)  09/25/19 239 lb 1.6 oz (108.5 kg)    BP Readings from Last 3 Encounters:  05/02/20 128/88  10/09/19 124/78  09/25/19 131/77    Assessment  1. Essential hypertension with goal blood pressure less than 140/90   2. Attention deficit disorder (ADD) without hyperactivity   3. Alcoholism in recovery ([place])   4. IFG (impaired fasting glucose)   5. Need for influenza vaccination      Plan    HTN is controlled. Continue meds. On ace. Will recheck3 months and adjust up meds if diastolic remains elevated. Pt reports home readings are typically better.   Diabetes is currently well controlled. Now IFG or diet controlled. Discussed weight loss and diabetic diet. Recheck 3 months. On ace. Flu shot today. Will  consider statin if indicated next visit  Congratulated on recovery. Refilled naltrexone; try to wean over next 3 months if able  ADD well controlled. Refilled meds x 3 months.   Follow up: 3-4 months for cpe/dm/htn fu. Orders Placed This Encounter  Procedures  . Flu Vaccine QUAD 6+ mos PF IM (Fluarix Quad PF)  . POCT HgB A1C   Meds ordered this encounter  Medications  . naltrexone (DEPADE) 50 MG tablet    Sig: Take 1 tablet (50 mg total) by mouth daily.    Dispense:  90 tablet    Refill:  0  . DISCONTD: amphetamine-dextroamphetamine (ADDERALL XR) 30 MG 24 hr capsule    Sig: Take 1 capsule (30 mg total) by mouth daily.    Dispense:  30 capsule    Refill:  0    Please keep on file for next refill due October  . DISCONTD: amphetamine-dextroamphetamine (ADDERALL) 10 MG tablet    Sig: Take 1 tablet (10 mg total) by mouth daily as needed.    Dispense:  30 tablet    Refill:  0  . amphetamine-dextroamphetamine (ADDERALL XR) 30 MG 24 hr capsule    Sig: Take 1 capsule (30 mg total) by mouth daily.    Dispense:  30 capsule    Refill:  0    Please keep on file for next refill due November  . amphetamine-dextroamphetamine (ADDERALL  XR) 30 MG 24 hr capsule    Sig: Take 1 capsule (30 mg total) by mouth daily.    Dispense:  30 capsule    Refill:  0  . amphetamine-dextroamphetamine (ADDERALL) 10 MG tablet    Sig: Take 1 tablet (10 mg total) by mouth daily as needed.    Dispense:  30 tablet    Refill:  0    Please keep on file for next refill due October  . amphetamine-dextroamphetamine (ADDERALL) 10 MG tablet    Sig: Take 1 tablet (10 mg total) by mouth daily as needed.    Dispense:  30 tablet    Refill:  0    Please keep on file for next refill due November      Immunization History  Administered Date(s) Administered  . Hepatitis B, adult 06/21/2018, 07/24/2018  . Influenza Inj Mdck Quad Pf 06/10/2017, 06/04/2019  . Influenza Nasal 05/15/2013, 05/27/2014  . Influenza,  Quadrivalent, Recombinant, Inj, Pf 06/10/2017  . Influenza, Seasonal, Injecte, Preservative Fre 06/02/2015, 05/31/2016  . Influenza,inj,Quad PF,6+ Mos 06/21/2018, 05/02/2020  . Influenza-Unspecified 06/21/2018, 06/04/2019  . PFIZER SARS-COV-2 Vaccination 10/25/2019, 11/22/2019  . Pneumococcal Polysaccharide-23 09/14/2016  . Tdap 09/06/2014, 06/10/2019    Diabetes Related Lab Review: Lab Results  Component Value Date   HGBA1C 6.2 (A) 05/02/2020   HGBA1C 6.7 (H) 10/09/2019   HGBA1C 5.7 (A) 09/19/2018    No results found for: Derl Barrow Lab Results  Component Value Date   CREATININE 0.71 10/09/2019   BUN 15 10/09/2019   NA 137 10/09/2019   K 4.5 10/09/2019   CL 100 10/09/2019   CO2 29 10/09/2019   Lab Results  Component Value Date   CHOL 137 10/09/2019   CHOL 146 09/19/2018   CHOL 140 09/26/2017   Lab Results  Component Value Date   HDL 43.20 10/09/2019   HDL 54.70 09/19/2018   HDL 54.90 09/26/2017   Lab Results  Component Value Date   LDLCALC 63 10/09/2019   LDLCALC 64 09/19/2018   LDLCALC 56 09/26/2017   Lab Results  Component Value Date   TRIG 152.0 (H) 10/09/2019   TRIG 135.0 09/19/2018   TRIG 147.0 09/26/2017   Lab Results  Component Value Date   CHOLHDL 3 10/09/2019   CHOLHDL 3 09/19/2018   CHOLHDL 3 09/26/2017   No results found for: LDLDIRECT The 10-year ASCVD risk score [name], et al., 2013) is: 1.9%   Values used to calculate the score:     Age: 14 years     Sex: Male     Is Non-Hispanic African American: No     Diabetic: No     Tobacco smoker: No     Systolic Blood Pressure: 254 mmHg     Is BP treated: Yes     HDL Cholesterol: 43.2 mg/dL     Total Cholesterol: 137 mg/dL I have reviewed the [place], Fam and Soc history. Patient Active Problem List   Diagnosis Date Noted  . Alcoholism in recovery ([place]) 11/24/2017  . Fatty liver, alcoholic 27/01/2375    Liver ultrasound 2017, [place].   . IFG (impaired fasting glucose) 03/25/2015   . Class 1 obesity due to excess calories with serious comorbidity and body mass index (BMI) of 34.0 to 34.9 in adult 08/02/2013  . Essential hypertension with goal blood pressure less than 140/90 08/02/2013  . Attention deficit disorder (ADD) without hyperactivity 04/17/2013    Overview:  Has used adderall in past - works well. 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     Social History: Patient  reports that he quit smoking about 4 years ago. His smoking use included cigarettes. He has a 45.00 pack-year smoking history. He has never used smokeless tobacco. He reports previous alcohol use. He reports current drug use. Drug: Marijuana.  Review of Systems: Ophthalmic: negative for eye pain, loss of vision or double vision Cardiovascular: negative for chest pain Respiratory: negative for SOB or persistent cough Gastrointestinal: negative for abdominal pain Genitourinary: negative for dysuria or gross hematuria MSK: negative for foot lesions Neurologic: negative for weakness or gait disturbance  Objective  Vitals: BP 128/88   Pulse 84   Temp 98.3 F (36.8 C) (Temporal)   Resp 18   Ht 5' 10"  (1.778 m)   Wt 256 lb 9.6 oz (116.4 kg)   SpO2 97%   BMI 36.82 kg/m  General: well appearing, no acute distress  Psych:  Alert and oriented, normal mood and affect HEENT:  Normocephalic, atraumatic, moist mucous membranes, supple neck  Cardiovascular:  Nl S1 and S2, RRR without murmur, gallop or rub. no edema Respiratory:  Good breath sounds bilaterally, CTAB with normal effort, no rales   Diabetic education: ongoing education regarding chronic disease management for diabetes was given today. We continue to reinforce the ABC's of diabetic management: A1c (<7 or 8 dependent upon patient), tight blood pressure control, and cholesterol management with goal LDL < 100 minimally. We discuss diet strategies, exercise recommendations, medication options and possible side effects.  At each visit, we review recommended immunizations and preventive care recommendations for diabetics and stress that good diabetic control can prevent other problems. See below for this patient's data.    Commons side effects, risks, benefits, and alternatives for medications and treatment plan prescribed today were discussed, and the patient expressed understanding of the given instructions. Patient is instructed to call or message via MyChart if he/she has any questions or concerns regarding our treatment plan. No barriers to understanding were identified. We discussed Red Flag symptoms and signs in detail. Patient expressed understanding regarding what to do in case of urgent or emergency type symptoms.   Medication list was reconciled, printed and provided to the patient in AVS. Patient instructions and summary information was reviewed with the patient as documented in the AVS. This note was prepared with assistance of Dragon voice recognition software. Occasional wrong-word or sound-a-like substitutions may have occurred due to the inherent limitations of voice recognition software  This visit occurred during the SARS-CoV-2 public health emergency.  Safety protocols were in place, including screening questions prior to the visit, additional usage of staff PPE, and extensive cleaning of exam room while observing appropriate contact time as indicated for disinfecting solutions.

## 2020-05-27 ENCOUNTER — Encounter: Payer: Self-pay | Admitting: Family Medicine

## 2020-06-30 ENCOUNTER — Encounter: Payer: Self-pay | Admitting: Family Medicine

## 2020-07-04 ENCOUNTER — Encounter: Payer: Self-pay | Admitting: Family Medicine

## 2020-07-04 ENCOUNTER — Ambulatory Visit (INDEPENDENT_AMBULATORY_CARE_PROVIDER_SITE_OTHER): Payer: BC Managed Care – PPO | Admitting: Family Medicine

## 2020-07-04 ENCOUNTER — Other Ambulatory Visit: Payer: Self-pay

## 2020-07-04 VITALS — BP 120/86 | HR 92 | Temp 98.4°F | Wt 256.1 lb

## 2020-07-04 DIAGNOSIS — L820 Inflamed seborrheic keratosis: Secondary | ICD-10-CM | POA: Diagnosis not present

## 2020-07-04 DIAGNOSIS — D229 Melanocytic nevi, unspecified: Secondary | ICD-10-CM

## 2020-07-04 MED ORDER — LIDOCAINE-EPINEPHRINE 1 %-1:100000 IJ SOLN
2.0000 mL | Freq: Once | INTRAMUSCULAR | Status: AC
  Administered 2020-07-04: 2 mL

## 2020-07-04 NOTE — Patient Instructions (Addendum)
Please follow up as scheduled for your next visit with me: 10/09/2020   If you have any questions or concerns, please don't hesitate to send me a message via MyChart or call the office at [phone]. Thank you for visiting with [place] today! It's our pleasure caring for you.   Skin Biopsy, Care After This sheet gives you information about how to care for yourself after your procedure. Your health care provider may also give you more specific instructions. If you have problems or questions, contact your health care provider. What can I expect after the procedure? After the procedure, it is common to have:  Soreness.  Bruising.  Itching. Follow these instructions at home: Biopsy site care Follow instructions from your health care provider about how to take care of your biopsy site. Make sure you:  Wash your hands with soap and water before and after you change your bandage (dressing). If soap and water are not available, use hand sanitizer.  Apply ointment on your biopsy site as directed by your health care provider.  Change your dressing as told by your health care provider.  Leave stitches (sutures), skin glue, or adhesive strips in place. These skin closures may need to stay in place for 2 weeks or longer. If adhesive strip edges start to loosen and curl up, you may trim the loose edges. Do not remove adhesive strips completely unless your health care provider tells you to do that.  If the biopsy area bleeds, apply gentle pressure for 10 minutes. Check your biopsy site every day for signs of infection. Check for:  Redness, swelling, or pain.  Fluid or blood.  Warmth.  Pus or a bad smell.  General instructions  Rest and then return to your normal activities as told by your health care provider.  Take over-the-counter and prescription medicines only as told by your health care provider.  Keep all follow-up visits as told by your health care provider. This is  important. Contact a health care provider if:  You have redness, swelling, or pain around your biopsy site.  You have fluid or blood coming from your biopsy site.  Your biopsy site feels warm to the touch.  You have pus or a bad smell coming from your biopsy site.  You have a fever.  Your sutures, skin glue, or adhesive strips loosen or come off sooner than expected. Get help right away if:  You have bleeding that does not stop with pressure or a dressing. Summary  After the procedure, it is common to have soreness, bruising, and itching at the site.  Follow instructions from your health care provider about how to take care of your biopsy site.  Check your biopsy site every day for signs of infection.  Contact a health care provider if you have redness, swelling, or pain around your biopsy site, or your biopsy site feels warm to the touch.  Keep all follow-up visits as told by your health care provider. This is important. This information is not intended to replace advice given to you by your health care provider. Make sure you discuss any questions you have with your health care provider. Document Revised: 02/13/2018 Document Reviewed: 02/13/2018 Elsevier Patient Education  [place].

## 2020-07-04 NOTE — Progress Notes (Signed)
Subjective  CC:  Chief Complaint  Patient presents with   Nevus    left side of forehead    HPI: George Campbell is a 49 y.o. male who presents to the office today to address the problems listed above in the chief complaint.  Has had mole on left forehead for several years but over last month, has grown rapidly and has darkened. Gets irritated and will bleed. No FH of melanoma. No personal history of skin cancer.    Assessment  1. Atypical mole      Plan   Atypical mole:  ? Cutaneous horn vs mole with atypia vs other. Shave biopsy, routine post procedure care instructions given and await pathology. No complications.   Follow up: for cpe  10/09/2020  No orders of the defined types were placed in this encounter.  No orders of the defined types were placed in this encounter.     I reviewed the patients updated PMH, FH, and SocHx.    Patient Active Problem List   Diagnosis Date Noted   Alcoholism in recovery ([place]) 11/24/2017   Fatty liver, alcoholic 09/73/5329   IFG (impaired fasting glucose) 03/25/2015   Class 1 obesity due to excess calories with serious comorbidity and body mass index (BMI) of 34.0 to 34.9 in adult 08/02/2013   Essential hypertension with goal blood pressure less than 140/90 08/02/2013   Attention deficit disorder (ADD) without hyperactivity 04/17/2013   Current Meds  Medication Sig   amLODipine (NORVASC) 10 MG tablet TAKE 1 TABLET(10 MG) BY MOUTH DAILY   amphetamine-dextroamphetamine (ADDERALL XR) 30 MG 24 hr capsule Take 1 capsule (30 mg total) by mouth daily.   amphetamine-dextroamphetamine (ADDERALL XR) 30 MG 24 hr capsule Take 1 capsule (30 mg total) by mouth daily.   amphetamine-dextroamphetamine (ADDERALL) 10 MG tablet Take 1 tablet (10 mg total) by mouth daily as needed.   amphetamine-dextroamphetamine (ADDERALL) 10 MG tablet Take 1 tablet (10 mg total) by mouth daily as needed.   lisinopril-hydrochlorothiazide (ZESTORETIC) 20-12.5  MG tablet Take 2 tablets by mouth daily.   [DISCONTINUED] naltrexone (DEPADE) 50 MG tablet Take 1 tablet (50 mg total) by mouth daily.    Allergies: Patient is allergic to strattera [atomoxetine hcl]. Family History: Patient family history includes Esophageal cancer in his maternal uncle; Healthy in his paternal uncle and son; Hypertension in his mother. Social History:  Patient  reports that he quit smoking about 4 years ago. His smoking use included cigarettes. He has a 45.00 pack-year smoking history. He has never used smokeless tobacco. He reports previous alcohol use. He reports current drug use. Drug: Marijuana.  Review of Systems: Constitutional: Negative for fever malaise or anorexia Cardiovascular: negative for chest pain Respiratory: negative for SOB or persistent cough Gastrointestinal: negative for abdominal pain  Objective  Vitals: BP 120/86    Pulse 92    Temp 98.4 F (36.9 C) (Temporal)    Wt 256 lb 1.6 oz (116.2 kg)    SpO2 98%    BMI 36.75 kg/m  General: no acute distress , A&Ox3 Skin:  Warm, no rashes, left forehead 3-4 mm raised dark mole  Shave Biopsy Procedure Note  Pre-operative Diagnosis: Suspicious lesion  Post-operative Diagnosis: same  Locations:left forehead  Indications: bleeding, rapidly growing  Anesthesia: Lidocaine 1% with epinephrine   Procedure Details   Patient informed of the risks (including bleeding and infection) and benefits of the  procedure and Verbal informed consent obtained.  The lesion and surrounding area were  given a sterile prep using alcohol. A derma blade was used to shave an area of skin 67m diameter.  Hemostasis achieved with silver nitrate. Antibiotic ointment and a sterile dressing applied.  The specimen was sent for pathologic examination. The patient tolerated the procedure well.  EBL: 0 ml  Condition: Stable  Complications: none.      Commons side effects, risks, benefits, and alternatives for  medications and treatment plan prescribed today were discussed, and the patient expressed understanding of the given instructions. Patient is instructed to call or message via MyChart if he/she has any questions or concerns regarding our treatment plan. No barriers to understanding were identified. We discussed Red Flag symptoms and signs in detail. Patient expressed understanding regarding what to do in case of urgent or emergency type symptoms.   Medication list was reconciled, printed and provided to the patient in AVS. Patient instructions and summary information was reviewed with the patient as documented in the AVS. This note was prepared with assistance of Dragon voice recognition software. Occasional wrong-word or sound-a-like substitutions may have occurred due to the inherent limitations of voice recognition software  This visit occurred during the SARS-CoV-2 public health emergency.  Safety protocols were in place, including screening questions prior to the visit, additional usage of staff PPE, and extensive cleaning of exam room while observing appropriate contact time as indicated for disinfecting solutions.

## 2020-07-09 ENCOUNTER — Encounter: Payer: Self-pay | Admitting: Family Medicine

## 2020-07-18 ENCOUNTER — Ambulatory Visit: Payer: BC Managed Care – PPO | Admitting: Family Medicine

## 2020-07-28 ENCOUNTER — Other Ambulatory Visit: Payer: Self-pay | Admitting: Family Medicine

## 2020-07-28 NOTE — Telephone Encounter (Signed)
Last refill: 07/02/20 #30,0 for both Last OV: 07/04/20 dx. Mole removal

## 2020-07-29 MED ORDER — AMPHETAMINE-DEXTROAMPHETAMINE 10 MG PO TABS: 10.0000 mg | ORAL_TABLET | Freq: Every day | ORAL | 0 refills | Status: DC | PRN

## 2020-07-29 MED ORDER — AMPHETAMINE-DEXTROAMPHET ER 30 MG PO CP24: 30.0000 mg | ORAL_CAPSULE | Freq: Every day | ORAL | 0 refills | Status: DC

## 2020-08-27 ENCOUNTER — Other Ambulatory Visit: Payer: Self-pay | Admitting: Family Medicine

## 2020-08-27 NOTE — Telephone Encounter (Signed)
Last refill: 07/28/20 #30, 0 Last OV: 07/04/20 dx. Mole removal

## 2020-08-28 ENCOUNTER — Encounter: Payer: Self-pay | Admitting: Family Medicine

## 2020-09-01 MED ORDER — AMPHETAMINE-DEXTROAMPHETAMINE 10 MG PO TABS: 10.0000 mg | ORAL_TABLET | Freq: Every day | ORAL | 0 refills | Status: DC | PRN

## 2020-09-18 DIAGNOSIS — Z20828 Contact with and (suspected) exposure to other viral communicable diseases: Secondary | ICD-10-CM | POA: Diagnosis not present

## 2020-09-25 ENCOUNTER — Other Ambulatory Visit: Payer: Self-pay | Admitting: Family Medicine

## 2020-09-25 MED ORDER — AMPHETAMINE-DEXTROAMPHETAMINE 10 MG PO TABS: 10.0000 mg | ORAL_TABLET | Freq: Every day | ORAL | 0 refills | Status: DC | PRN

## 2020-09-25 MED ORDER — AMPHETAMINE-DEXTROAMPHET ER 30 MG PO CP24: 30.0000 mg | ORAL_CAPSULE | Freq: Every day | ORAL | 0 refills | Status: DC

## 2020-09-28 ENCOUNTER — Other Ambulatory Visit: Payer: Self-pay | Admitting: Family Medicine

## 2020-09-29 ENCOUNTER — Telehealth: Payer: Self-pay

## 2020-09-29 ENCOUNTER — Encounter (INDEPENDENT_AMBULATORY_CARE_PROVIDER_SITE_OTHER): Payer: Self-pay

## 2020-09-29 ENCOUNTER — Encounter: Payer: Self-pay | Admitting: Family Medicine

## 2020-09-29 ENCOUNTER — Other Ambulatory Visit: Payer: Self-pay

## 2020-09-29 ENCOUNTER — Telehealth: Payer: BC Managed Care – PPO | Admitting: Physician Assistant

## 2020-09-29 DIAGNOSIS — Z20822 Contact with and (suspected) exposure to covid-19: Secondary | ICD-10-CM | POA: Diagnosis not present

## 2020-09-29 DIAGNOSIS — U071 COVID-19: Secondary | ICD-10-CM | POA: Diagnosis not present

## 2020-09-29 MED ORDER — ALBUTEROL SULFATE HFA 108 (90 BASE) MCG/ACT IN AERS: 2.0000 | INHALATION_SPRAY | Freq: Four times a day (QID) | RESPIRATORY_TRACT | 0 refills | Status: DC | PRN

## 2020-09-29 MED ORDER — BENZONATATE 100 MG PO CAPS: 100.0000 mg | ORAL_CAPSULE | Freq: Three times a day (TID) | ORAL | 0 refills | Status: DC | PRN

## 2020-09-29 NOTE — Telephone Encounter (Signed)
Medication just refilled on 1/27 - I am unable to deny request

## 2020-09-29 NOTE — Progress Notes (Signed)
E-Visit for Corona Virus Screening  We are sorry you are not feeling well. We are here to help!  You have tested positive for COVID-19, meaning that you were infected with the novel coronavirus and could give the virus to others.  It is vitally important that you stay home so you do not spread it to others.      Please continue isolation at home, for at least 10 days since the start of your symptoms and until you have had 24 hours with no fever (without taking a fever reducer) and with improving of symptoms.  If you have no symptoms but tested positive (or all symptoms resolve after 5 days and you have no fever) you can leave your house but continue to wear a mask around others for an additional 5 days. If you have a fever,continue to stay home until you have had 24 hours of no fever. Most cases improve 5-10 days from onset but we have seen a small number of patients who have gotten worse after the 10 days.  Please be sure to watch for worsening symptoms and remain taking the proper precautions.   Go to the nearest hospital ED for assessment if fever/cough/breathlessness are severe or illness seems like a threat to life.    The following symptoms may appear 2-14 days after exposure: . Fever . Cough . Shortness of breath or difficulty breathing . Chills . Repeated shaking with chills . Muscle pain . Headache . Sore throat . New loss of taste or smell . Fatigue . Congestion or runny nose . Nausea or vomiting . Diarrhea  You have been enrolled in [place] for COVID-19. Daily you will receive a questionnaire within the [place] website. Our COVID-19 response team will be monitoring your responses daily.  You can use medication such as A prescription cough medication called Tessalon Perles 100 mg. You may take 1-2 capsules every 8 hours as needed for cough and A prescription inhaler called Albuterol MDI 90 mcg /actuation 2 puffs every 4 hours as needed for shortness of breath,  wheezing, cough  You have tested positive for Covid but because you are not considered high risk you do not qualify for monoclonal antibody infusion.  Supportive care is all that is needed.   You may also take acetaminophen (Tylenol) as needed for fever.  HOME CARE: . Only take medications as instructed by your medical team. . Drink plenty of fluids and get plenty of rest. . A steam or ultrasonic humidifier can help if you have congestion.   GET HELP RIGHT AWAY IF YOU HAVE EMERGENCY WARNING SIGNS.  Call 911 or proceed to your closest emergency facility if: . You develop worsening high fever. . Trouble breathing . Bluish lips or face . Persistent pain or pressure in the chest . New confusion . Inability to wake or stay awake . You cough up blood. . Your symptoms become more severe . Inability to hold down food or fluids  This list is not all possible symptoms. Contact your medical provider for any symptoms that are severe or concerning to you.    Your e-visit answers were reviewed by a board certified advanced clinical practitioner to complete your personal care plan.  Depending on the condition, your plan could have included both over the counter or prescription medications.  If there is a problem please reply once you have received a response from your provider.  Your safety is important to [place].  If you have drug allergies  check your prescription carefully.    You can use MyChart to ask questions about today's visit, request a non-urgent call back, or ask for a work or school excuse for 24 hours related to this e-Visit. If it has been greater than 24 hours you will need to follow up with your provider, or enter a new e-Visit to address those concerns. You will get an e-mail in the next two days asking about your experience.  I hope that your e-visit has been valuable and will speed your recovery. Thank you for using e-visits.

## 2020-09-29 NOTE — Progress Notes (Signed)
I have spent 5 minutes in review of e-visit questionnaire, review and updating patient chart, medical decision making and response to patient.   Michalle Rademaker Cody Kahiau Schewe, PA-C    

## 2020-09-29 NOTE — Telephone Encounter (Signed)
Refill request sent to PCP.

## 2020-09-29 NOTE — Telephone Encounter (Signed)
Patient called and advised the call is in reference to the worsening symptoms on the COVID questionnaire. He says he was just diagnosed today, so the questions he answered was truthful, but not in reference to COVID. He says he feels like he has a bad cold, that he's fine right now. I advised to follow the care plan given, continue the questionnaires, and follow up with PCP.

## 2020-09-29 NOTE — Telephone Encounter (Signed)
amphetamine-dextroamphetamine (ADDERALL XR) 30 MG 24 hr capsule  amphetamine-dextroamphetamine (ADDERALL) 10 MG tablet    [facility] pharmacy is telling pt they did not receive the prescription for these medications.

## 2020-09-29 NOTE — Telephone Encounter (Signed)
Please refill, pharmacy did not get refill on 09/25/20

## 2020-09-30 ENCOUNTER — Other Ambulatory Visit: Payer: Self-pay | Admitting: Family Medicine

## 2020-09-30 ENCOUNTER — Encounter (INDEPENDENT_AMBULATORY_CARE_PROVIDER_SITE_OTHER): Payer: Self-pay

## 2020-09-30 ENCOUNTER — Telehealth: Payer: Self-pay

## 2020-09-30 MED ORDER — AMPHETAMINE-DEXTROAMPHET ER 30 MG PO CP24: 30.0000 mg | ORAL_CAPSULE | Freq: Every day | ORAL | 0 refills | Status: DC

## 2020-09-30 MED ORDER — AMPHETAMINE-DEXTROAMPHETAMINE 10 MG PO TABS: 10.0000 mg | ORAL_TABLET | Freq: Every day | ORAL | 0 refills | Status: DC | PRN

## 2020-09-30 NOTE — Telephone Encounter (Signed)
Pt is following up on this. He spoke to pharmacy 2 more times and they say nothing was ever sent to them.

## 2020-09-30 NOTE — Telephone Encounter (Signed)
PCP is aware that prior scripts were not received. She is going to send in new refills

## 2020-09-30 NOTE — Telephone Encounter (Signed)
Patient states that he is able to eat but just can't taste anything. Patient advise on symptoms per protocol:   If appetite becomes worse: encourage patient to drink fluids as tolerated, work their way up to bland solid food such as crackers, pretzels, soup, bread or applesauce and boiled starches.   If patient is unable to tolerate any foods or liquids, notify PCP.   IF PATIENT DEVELOPS SEVERE VOMITING (MORE THAN 6 TIMES A DAY AND OR >8 HOURS) AND/OR SEVERE ABDOMINAL PAIN ADVISE PATIENT TO CALL 911 AND SEEK TREATMENT IN ED

## 2020-09-30 NOTE — Addendum Note (Signed)
Addended by: Billey Chang on: 09/30/2020 10:32 AM   Modules accepted: Orders

## 2020-09-30 NOTE — Telephone Encounter (Signed)
Sent in [place] again.

## 2020-09-30 NOTE — Telephone Encounter (Signed)
LAST APPOINTMENT DATE: 09/29/2020   NEXT APPOINTMENT DATE: 10/09/2020

## 2020-10-02 ENCOUNTER — Telehealth: Payer: Self-pay

## 2020-10-02 ENCOUNTER — Encounter (INDEPENDENT_AMBULATORY_CARE_PROVIDER_SITE_OTHER): Payer: Self-pay

## 2020-10-02 NOTE — Telephone Encounter (Signed)
Patient states that he has some diarrhea this morning but thinks it's from the medication that he is on. Patient states that he is feeling better. Patient advise on symptoms per protocol as follows:   If diarrhea remains the same: encourage patient to drink oral fluids and bland foods.   Avoid alcohol, spicy foods, caffeine or fatty foods that could make diarrhea worse.   Continue to monitor for signs of dehydration (increased thirst decreased urine output, yellow urine, dry skin, headache or dizziness).   Advise patient to try OTC medication (Imodium, kaopectate, Pepto-Bismol) as per manufacturer's instructions.   If worsening diarrhea occurs and becomes severe (6-7 bowel movements a day): notify PCP   If diarrhea last greater than 7 days: notify PCP   IF SIGNS OF DEHYDRATION OCCUR (INCREASED THIRST, DECREASED URINE OUTPUT, YELLOW URINE, DRY SKIN, HEADACHE OR DIZZINESS) ADVISE PATIENT TO CALL 911 AND SEEK TREATMENT IN THE ED   Patient verbalized understanding

## 2020-10-03 ENCOUNTER — Encounter: Payer: Self-pay | Admitting: Family Medicine

## 2020-10-09 ENCOUNTER — Other Ambulatory Visit: Payer: Self-pay

## 2020-10-09 ENCOUNTER — Encounter: Payer: Self-pay | Admitting: Family Medicine

## 2020-10-09 ENCOUNTER — Ambulatory Visit (INDEPENDENT_AMBULATORY_CARE_PROVIDER_SITE_OTHER): Payer: BC Managed Care – PPO | Admitting: Family Medicine

## 2020-10-09 VITALS — BP 118/86 | HR 95 | Temp 98.4°F | Ht 70.0 in | Wt 260.6 lb

## 2020-10-09 DIAGNOSIS — Z0000 Encounter for general adult medical examination without abnormal findings: Secondary | ICD-10-CM

## 2020-10-09 DIAGNOSIS — F988 Other specified behavioral and emotional disorders with onset usually occurring in childhood and adolescence: Secondary | ICD-10-CM | POA: Diagnosis not present

## 2020-10-09 DIAGNOSIS — R7301 Impaired fasting glucose: Secondary | ICD-10-CM | POA: Diagnosis not present

## 2020-10-09 DIAGNOSIS — E669 Obesity, unspecified: Secondary | ICD-10-CM | POA: Diagnosis not present

## 2020-10-09 DIAGNOSIS — M7581 Other shoulder lesions, right shoulder: Secondary | ICD-10-CM

## 2020-10-09 DIAGNOSIS — F1021 Alcohol dependence, in remission: Secondary | ICD-10-CM | POA: Diagnosis not present

## 2020-10-09 DIAGNOSIS — K700 Alcoholic fatty liver: Secondary | ICD-10-CM

## 2020-10-09 DIAGNOSIS — I10 Essential (primary) hypertension: Secondary | ICD-10-CM | POA: Diagnosis not present

## 2020-10-09 LAB — CBC WITH DIFFERENTIAL/PLATELET
Basophils Absolute: 0.1 10*3/uL (ref 0.0–0.1)
Basophils Relative: 0.6 % (ref 0.0–3.0)
Eosinophils Absolute: 0.3 10*3/uL (ref 0.0–0.7)
Eosinophils Relative: 2.6 % (ref 0.0–5.0)
HCT: 42.4 % (ref 39.0–52.0)
Hemoglobin: 14.7 g/dL (ref 13.0–17.0)
Lymphocytes Relative: 23.2 % (ref 12.0–46.0)
Lymphs Abs: 2.3 10*3/uL (ref 0.7–4.0)
MCHC: 34.8 g/dL (ref 30.0–36.0)
MCV: 84.4 fl (ref 78.0–100.0)
Monocytes Absolute: 0.9 10*3/uL (ref 0.1–1.0)
Monocytes Relative: 8.9 % (ref 3.0–12.0)
Neutro Abs: 6.3 10*3/uL (ref 1.4–7.7)
Neutrophils Relative %: 64.7 % (ref 43.0–77.0)
Platelets: 345 10*3/uL (ref 150.0–400.0)
RBC: 5.02 Mil/uL (ref 4.22–5.81)
RDW: 13.1 % (ref 11.5–15.5)
WBC: 9.8 10*3/uL (ref 4.0–10.5)

## 2020-10-09 LAB — COMPREHENSIVE METABOLIC PANEL
ALT: 36 U/L (ref 0–53)
AST: 31 U/L (ref 0–37)
Albumin: 4.4 g/dL (ref 3.5–5.2)
Alkaline Phosphatase: 98 U/L (ref 39–117)
BUN: 10 mg/dL (ref 6–23)
CO2: 30 mEq/L (ref 19–32)
Calcium: 9.9 mg/dL (ref 8.4–10.5)
Chloride: 97 mEq/L (ref 96–112)
Creatinine, Ser: 0.78 mg/dL (ref 0.40–1.50)
GFR: 105.87 mL/min (ref >60.00)
Glucose, Bld: 299 mg/dL — ABNORMAL HIGH (ref 70–99)
Potassium: 4.2 mEq/L (ref 3.5–5.1)
Sodium: 135 mEq/L (ref 135–145)
Total Bilirubin: 0.5 mg/dL (ref 0.2–1.2)
Total Protein: 8 g/dL (ref 6.0–8.3)

## 2020-10-09 LAB — LIPID PANEL
Cholesterol: 108 mg/dL (ref 0–200)
HDL: 31 mg/dL — ABNORMAL LOW (ref >39.00)
LDL Cholesterol: 41 mg/dL (ref 0–99)
NonHDL: 76.68
Total CHOL/HDL Ratio: 3
Triglycerides: 179 mg/dL — ABNORMAL HIGH (ref 0.0–149.0)
VLDL: 35.8 mg/dL (ref 0.0–40.0)

## 2020-10-09 LAB — HEMOGLOBIN A1C: Hgb A1c MFr Bld: 8.3 % — ABNORMAL HIGH (ref 4.6–6.5)

## 2020-10-09 MED ORDER — DICLOFENAC SODIUM 75 MG PO TBEC: 75.0000 mg | DELAYED_RELEASE_TABLET | Freq: Two times a day (BID) | ORAL | 0 refills | Status: DC

## 2020-10-09 MED ORDER — LISINOPRIL-HYDROCHLOROTHIAZIDE 20-12.5 MG PO TABS: 2.0000 | ORAL_TABLET | Freq: Every day | ORAL | 3 refills | Status: DC

## 2020-10-09 MED ORDER — AMPHETAMINE-DEXTROAMPHET ER 30 MG PO CP24: 30.0000 mg | ORAL_CAPSULE | Freq: Every day | ORAL | 0 refills | Status: DC

## 2020-10-09 MED ORDER — AMPHETAMINE-DEXTROAMPHETAMINE 10 MG PO TABS: 10.0000 mg | ORAL_TABLET | Freq: Every day | ORAL | 0 refills | Status: DC | PRN

## 2020-10-09 NOTE — Patient Instructions (Addendum)
Please return in 6 months for hypertension follow up.  Send refill request in May for your adderall please.   I will release your lab results to you on your MyChart account with further instructions. Please reply with any questions.   If you have any questions or concerns, please don't hesitate to send me a message via MyChart or call the office at [phone]. Thank you for visiting with [place] today! It's our pleasure caring for you.   Fat and Cholesterol Restricted Eating Plan Getting too much fat and cholesterol in your diet may cause health problems. Choosing the right foods helps keep your fat and cholesterol at normal levels. This can keep you from getting certain diseases. Your doctor may recommend an eating plan that includes:  Total fat: ______% or less of total calories a day.  Saturated fat: ______% or less of total calories a day.  Cholesterol: less than _________mg a day.  Fiber: ______g a day. What are tips for following this plan? Meal planning  At meals, divide your plate into four equal parts: ? Fill one-half of your plate with vegetables and green salads. ? Fill one-fourth of your plate with whole grains. ? Fill one-fourth of your plate with low-fat (lean) protein foods.  Eat fish that is high in omega-3 fats at least two times a week. This includes mackerel, tuna, sardines, and salmon.  Eat foods that are high in fiber, such as whole grains, beans, apples, broccoli, carrots, peas, and barley. General tips  Work with your doctor to lose weight if you need to.  Avoid: ? Foods with added sugar. ? Fried foods. ? Foods with partially hydrogenated oils.  Limit alcohol intake to no more than 1 drink a day for nonpregnant women and 2 drinks a day for men. One drink equals 12 oz of beer, 5 oz of wine, or 1 oz of hard liquor.   Reading food labels  Check food labels for: ? Trans fats. ? Partially hydrogenated oils. ? Saturated fat (g) in each  serving. ? Cholesterol (mg) in each serving. ? Fiber (g) in each serving.  Choose foods with healthy fats, such as: ? Monounsaturated fats. ? Polyunsaturated fats. ? Omega-3 fats.  Choose grain products that have whole grains. Look for the word "whole" as the first word in the ingredient list. Cooking  Cook foods using low-fat methods. These include baking, boiling, grilling, and broiling.  Eat more home-cooked foods. Eat at restaurants and buffets less often.  Avoid cooking using saturated fats, such as butter, cream, palm oil, palm kernel oil, and coconut oil. Recommended foods Fruits  All fresh, canned (in natural juice), or frozen fruits. Vegetables  Fresh or frozen vegetables (raw, steamed, roasted, or grilled). Green salads. Grains  Whole grains, such as whole wheat or whole grain breads, crackers, cereals, and pasta. Unsweetened oatmeal, bulgur, barley, quinoa, or brown rice. Corn or whole wheat flour tortillas. Meats and other protein foods  Ground beef (85% or leaner), grass-fed beef, or beef trimmed of fat. Skinless chicken or [place]. Ground chicken or [place]. Pork trimmed of fat. All fish and seafood. Egg whites. Dried beans, peas, or lentils. Unsalted nuts or seeds. Unsalted canned beans. Nut butters without added sugar or oil. Dairy  Low-fat or nonfat dairy products, such as skim or 1% milk, 2% or reduced-fat cheeses, low-fat and fat-free ricotta or cottage cheese, or plain low-fat and nonfat yogurt. Fats and oils  Tub margarine without trans fats. Light or reduced-fat mayonnaise and salad dressings.  Avocado. Olive, canola, sesame, or safflower oils. The items listed above may not be a complete list of foods and beverages you can eat. Contact a dietitian for more information.   Foods to avoid Fruits  Canned fruit in heavy syrup. Fruit in cream or butter sauce. Fried fruit. Vegetables  Vegetables cooked in cheese, cream, or butter sauce. Fried  vegetables. Grains  White bread. White pasta. White rice. Cornbread. Bagels, pastries, and croissants. Crackers and snack foods that contain trans fat and hydrogenated oils. Meats and other protein foods  Fatty cuts of meat. Ribs, chicken wings, bacon, sausage, bologna, salami, chitterlings, fatback, hot dogs, bratwurst, and packaged lunch meats. Liver and organ meats. Whole eggs and egg yolks. Chicken and [place] with skin. Fried meat. Dairy  Whole or 2% milk, cream, half-and-half, and cream cheese. Whole milk cheeses. Whole-fat or sweetened yogurt. Full-fat cheeses. Nondairy creamers and whipped toppings. Processed cheese, cheese spreads, and cheese curds. Beverages  Alcohol. Sugar-sweetened drinks such as sodas, lemonade, and fruit drinks. Fats and oils  Butter, stick margarine, lard, shortening, ghee, or bacon fat. Coconut, palm kernel, and palm oils. Sweets and desserts  Corn syrup, sugars, honey, and molasses. Candy. Jam and jelly. Syrup. Sweetened cereals. Cookies, pies, cakes, donuts, muffins, and ice cream. The items listed above may not be a complete list of foods and beverages you should avoid. Contact a dietitian for more information. Summary  Choosing the right foods helps keep your fat and cholesterol at normal levels. This can keep you from getting certain diseases.  At meals, fill one-half of your plate with vegetables and green salads.  Eat high-fiber foods, like whole grains, beans, apples, carrots, peas, and barley.  Limit added sugar, saturated fats, alcohol, and fried foods. This information is not intended to replace advice given to you by your health care provider. Make sure you discuss any questions you have with your health care provider. Document Revised: 12/19/2019 Document Reviewed: 12/19/2019 Elsevier Patient Education  2021 [employer].

## 2020-10-09 NOTE — Progress Notes (Signed)
Subjective  Chief Complaint  Patient presents with  . Annual Exam    Not fasting     HPI: George Campbell is a 50 y.o. male who presents to [facility] today for a Male Wellness Visit. He also has the concerns and/or needs as listed above in the chief complaint. These will be addressed in addition to the Health Maintenance Visit.   Wellness Visit: annual visit with health maintenance review and exam    Health maintenance: Nonfasting for physical today.  Continues to eat more than he should.  He has gained a few more pounds.  Wants to start working on weight loss.  Contributes most diet changes to his sobriety.  Tomorrow is 18 months sober.  He is happy about this.  Immunizations are up-to-date.  Body mass index is 37.39 kg/m. Wt Readings from Last 3 Encounters:  10/09/20 260 lb 9.6 oz (118.2 kg)  07/04/20 256 lb 1.6 oz (116.2 kg)  05/02/20 256 lb 9.6 oz (116.4 kg)     Chronic disease management visit and/or acute problem visit:  ADD: Remains well controlled.  He takes a long-acting Adderall in the morning and then later in the afternoon will take a short acting 10 mg dose.  This continues to work well without side effects.  Alcoholism in recovery: Continues to do very well.  No longer having cravings.  History of elevated LFTs due to alcohol.  No right upper quadrant pain due for recheck.  He may also have fatty liver.  Impaired fasting glucose: He denies symptoms of hypoglycemia but does admit to eating a poor diet and gaining weight.  Hypertension: Does not check blood pressures at home.  Feels well.  No chest pain or shortness of breath.  No lower extremity edema.  No adverse side effects from medications.   Patient Active Problem List   Diagnosis Date Noted  . Obesity (BMI 30-39.9) 10/09/2020  . Alcoholism in recovery ([place]) 11/24/2017  . Fatty liver, alcoholic 29/51/8841  . IFG (impaired fasting glucose) 03/25/2015  . Class 1 obesity due to  excess calories with serious comorbidity and body mass index (BMI) of 34.0 to 34.9 in adult 08/02/2013  . Essential hypertension with goal blood pressure less than 140/90 08/02/2013  . Attention deficit disorder (ADD) without hyperactivity 04/17/2013   Health Maintenance  Topic Date Due  . COLONOSCOPY (Pts 45-47yr Insurance coverage will need to be confirmed)  Never done  . COVID-19 Vaccine (4 - Booster for Pfizer series) 11/26/2020  . TETANUS/TDAP  06/09/2029  . INFLUENZA VACCINE  Completed  . Hepatitis C Screening  Completed  . HIV Screening  Completed   Immunization History  Administered Date(s) Administered  . Hepatitis B, adult 06/21/2018, 07/24/2018  . Influenza Inj Mdck Quad Pf 06/10/2017, 06/04/2019  . Influenza Nasal 05/15/2013, 05/27/2014  . Influenza, Quadrivalent, Recombinant, Inj, Pf 06/10/2017  . Influenza, Seasonal, Injecte, Preservative Fre 06/02/2015, 05/31/2016  . Influenza,inj,Quad PF,6+ Mos 06/21/2018, 05/02/2020  . Influenza-Unspecified 06/21/2018, 06/04/2019  . PFIZER(Purple Top)SARS-COV-2 Vaccination 10/25/2019, 11/22/2019, 05/29/2020  . Pneumococcal Polysaccharide-23 09/14/2016  . Tdap 09/06/2014, 06/10/2019   We updated and reviewed the patient's past history in detail and it is documented below. Allergies: Patient is allergic to strattera [atomoxetine hcl]. Past Medical History  has a past medical history of Anxiety, Depression, Hypertension, and Inguinal hernia. Past Surgical History Patient  has a past surgical history that includes Knee arthrodesis (Right, 2002); Mouth surgery; Hernia repair; and XI Robotic assisted inguinal hernia  repair with mesh (Bilateral, 09/25/2019). Social History Patient  reports that he quit smoking about 4 years ago. His smoking use included cigarettes. He has a 45.00 pack-year smoking history. He has never used smokeless tobacco. He reports previous alcohol use. He reports current drug use. Drug: Marijuana. Family  History family history includes Esophageal cancer in his maternal uncle; Healthy in his paternal uncle and son; Hypertension in his mother. Review of Systems: Constitutional: negative for fever or malaise Ophthalmic: negative for photophobia, double vision or loss of vision Cardiovascular: negative for chest pain, dyspnea on exertion, or new LE swelling Respiratory: negative for SOB or persistent cough Gastrointestinal: negative for abdominal pain, change in bowel habits or melena Genitourinary: negative for dysuria or gross hematuria Musculoskeletal: negative for new gait disturbance or muscular weakness Integumentary: negative for new or persistent rashes Neurological: negative for TIA or stroke symptoms Psychiatric: negative for SI or delusions Allergic/Immunologic: negative for hives  Patient Care Team    Relationship Specialty Notifications Start End  Leamon Arnt, MD PCP - General Family Medicine  09/26/17   Clovis Riley, MD Consulting Physician General Surgery  09/26/19    Objective  Vitals: BP 118/86   Pulse 95   Temp 98.4 F (36.9 C) (Temporal)   Ht 5' 10"  (1.778 m)   Wt 260 lb 9.6 oz (118.2 kg)   SpO2 96%   BMI 37.39 kg/m  General:  Well developed, well nourished, no acute distress  Psych:  Alert and orientedx3,normal mood and affect HEENT:  Normocephalic, atraumatic, non-icteric sclera, PERRL, supple neck without adenopathy, mass or thyromegaly Cardiovascular:  Normal S1, S2, RRR without gallop, rub or murmur, nondisplaced PMI, +2 distal pulses in bilateral upper and lower extremities. Respiratory:  Good breath sounds bilaterally, CTAB with normal respiratory effort Gastrointestinal: normal bowel sounds, soft, non-tender, no noted masses. No HSM MSK: no deformities, contusions. Joints are without erythema or swelling. Spine and CVA region are nontender Skin:  Warm, no rashes or suspicious lesions noted Neurologic:    Mental status is normal. CN 2-11 are normal.  Gross motor and sensory exams are normal. Stable gait. No tremor GU: No inguinal hernias or adenopathy are appreciated bilaterally   Assessment  1. Annual physical exam   2. Attention deficit disorder (ADD) without hyperactivity   3. Essential hypertension with goal blood pressure less than 140/90   4. IFG (impaired fasting glucose)   5. Alcoholism in recovery ([place]) Chronic  6. Obesity (BMI 30-39.9)   7. Rotator cuff tendinitis, right   8. Fatty liver, alcoholic      Plan  Male Wellness Visit:  Age appropriate Health Maintenance and Prevention measures were discussed with patient. Included topics are cancer screening recommendations, ways to keep healthy (see AVS) including dietary and exercise recommendations, regular eye and dental care, use of seat belts, and avoidance of moderate alcohol use and tobacco use.   BMI: discussed patient's BMI and encouraged positive lifestyle modifications to help get to or maintain a target BMI.  HM needs and immunizations were addressed and ordered. See below for orders. See HM and immunization section for updates.  Routine labs and screening tests ordered including cmp, cbc and lipids where appropriate.  Discussed recommendations regarding Vit D and calcium supplementation (see AVS)  Chronic disease f/u and/or acute problem visit: (deemed necessary to be done in addition to the wellness visit):  ADD remains well controlled.  Refill medications for 3 months.  Adderall XR 30 mg daily and Adderall 10 mg  as needed  Hypertension: Good control.  Continue Zestoretic 20/12.5 daily.  Check renal function electrolytes.  Fatty liver and history of elevated LFTs: Recheck today.  Obesity: Recommend decreasing caloric intake and increasing exercise  Right shoulder pain, likely overuse rotator cuff tendinopathy: Ice and Voltaren for 2 weeks.  Follow-up if not improving  Impaired fasting glucose: Recheck A1c.  Discussed if trending upwards would have low  threshold to start Metformin to help with sugar metabolism and weight loss.  Patient agrees.  Recommend improving his diet.  Recommend weight loss.   Follow up: 6 months to recheck blood pressure and sugars, sooner if needs more help with right shoulder  Commons side effects, risks, benefits, and alternatives for medications and treatment plan prescribed today were discussed, and the patient expressed understanding of the given instructions. Patient is instructed to call or message via MyChart if he/she has any questions or concerns regarding our treatment plan. No barriers to understanding were identified. We discussed Red Flag symptoms and signs in detail. Patient expressed understanding regarding what to do in case of urgent or emergency type symptoms.   Medication list was reconciled, printed and provided to the patient in AVS. Patient instructions and summary information was reviewed with the patient as documented in the AVS. This note was prepared with assistance of Dragon voice recognition software. Occasional wrong-word or sound-a-like substitutions may have occurred due to the inherent limitations of voice recognition software  This visit occurred during the SARS-CoV-2 public health emergency.  Safety protocols were in place, including screening questions prior to the visit, additional usage of staff PPE, and extensive cleaning of exam room while observing appropriate contact time as indicated for disinfecting solutions.   Orders Placed This Encounter  Procedures  . CBC with Differential/Platelet  . Comprehensive metabolic panel  . Lipid panel  . Hemoglobin A1c   Meds ordered this encounter  Medications  . amphetamine-dextroamphetamine (ADDERALL XR) 30 MG 24 hr capsule    Sig: Take 1 capsule (30 mg total) by mouth daily.    Dispense:  30 capsule    Refill:  0  . amphetamine-dextroamphetamine (ADDERALL) 10 MG tablet    Sig: Take 1 tablet (10 mg total) by mouth daily as needed.     Dispense:  30 tablet    Refill:  0  . amphetamine-dextroamphetamine (ADDERALL) 10 MG tablet    Sig: Take 1 tablet (10 mg total) by mouth daily as needed.    Dispense:  30 tablet    Refill:  0  . amphetamine-dextroamphetamine (ADDERALL XR) 30 MG 24 hr capsule    Sig: Take 1 capsule (30 mg total) by mouth daily.    Dispense:  30 capsule    Refill:  0  . lisinopril-hydrochlorothiazide (ZESTORETIC) 20-12.5 MG tablet    Sig: Take 2 tablets by mouth daily.    Dispense:  180 tablet    Refill:  3  . diclofenac (VOLTAREN) 75 MG EC tablet    Sig: Take 1 tablet (75 mg total) by mouth 2 (two) times daily.    Dispense:  30 tablet    Refill:  0

## 2020-10-13 ENCOUNTER — Encounter: Payer: Self-pay | Admitting: Family Medicine

## 2020-10-16 ENCOUNTER — Other Ambulatory Visit: Payer: Self-pay

## 2020-10-16 MED ORDER — METFORMIN HCL 500 MG PO TABS: 500.0000 mg | ORAL_TABLET | Freq: Two times a day (BID) | ORAL | 3 refills | Status: DC

## 2020-11-03 ENCOUNTER — Other Ambulatory Visit: Payer: Self-pay | Admitting: Family Medicine

## 2020-11-03 MED ORDER — AMPHETAMINE-DEXTROAMPHETAMINE 10 MG PO TABS: 10.0000 mg | ORAL_TABLET | Freq: Every day | ORAL | 0 refills | Status: DC | PRN

## 2020-11-03 MED ORDER — AMPHETAMINE-DEXTROAMPHET ER 30 MG PO CP24: 30.0000 mg | ORAL_CAPSULE | Freq: Every day | ORAL | 0 refills | Status: DC

## 2020-11-21 ENCOUNTER — Other Ambulatory Visit: Payer: Self-pay | Admitting: Family Medicine

## 2020-11-21 MED ORDER — DICLOFENAC SODIUM 75 MG PO TBEC: 75.0000 mg | DELAYED_RELEASE_TABLET | Freq: Two times a day (BID) | ORAL | 0 refills | Status: DC

## 2020-11-26 ENCOUNTER — Other Ambulatory Visit: Payer: Self-pay | Admitting: Family Medicine

## 2020-12-13 ENCOUNTER — Encounter: Payer: Self-pay | Admitting: Family Medicine

## 2020-12-15 ENCOUNTER — Other Ambulatory Visit: Payer: Self-pay | Admitting: Family Medicine

## 2020-12-15 ENCOUNTER — Other Ambulatory Visit: Payer: Self-pay

## 2020-12-15 NOTE — Telephone Encounter (Signed)
..   LAST APPOINTMENT DATE: 11/26/2020   NEXT APPOINTMENT DATE:@5 /18/2022  MEDICATION:amphetamine-dextroamphetamine (ADDERALL XR) 30 MG 24 hr capsule    PHARMACY: [facility]  [address], [place]  Pt states his original pharmacy is out of the medication. Pt called the pharmacy listed above and they have medication in stock

## 2020-12-16 MED ORDER — AMPHETAMINE-DEXTROAMPHET ER 30 MG PO CP24: 30.0000 mg | ORAL_CAPSULE | Freq: Every day | ORAL | 0 refills | Status: DC

## 2020-12-16 NOTE — Addendum Note (Signed)
Addended by: Marian Sorrow on: 12/16/2020 09:49 AM   Modules accepted: Orders

## 2020-12-16 NOTE — Telephone Encounter (Signed)
Last refill: 11/03/20 #30, 0 Last OV: 10/09/20 dx. CPE

## 2020-12-16 NOTE — Telephone Encounter (Signed)
Refill request sent to PCP.

## 2020-12-16 NOTE — Telephone Encounter (Signed)
Pt is following up on this. In the absence of Dr. Jonni Sanger and Dr Rogers Blocker, sending to Dr. Jerline Pain and Aldona Bar, since this is day 2 of pt.'s request.

## 2020-12-16 NOTE — Telephone Encounter (Signed)
Spoke to pt told him Rx was sent on 4/1 for Adderall and you should have some left yet. Pt said the pharmacy where originally sent never filled Rx because they did not have it in stock. Told him okay will send request to Dr.  Jerline Pain to send to other pharmacy. Pt verbalized understanding.

## 2020-12-16 NOTE — Telephone Encounter (Signed)
Dr. Jerline Pain can you please send Rx for Adderral 30 mg to new pharmacy. Rx never filled due to pharmacy did not have in stock. Rx placed in cart with new pharmacy. Thanks.

## 2020-12-18 MED ORDER — AMPHETAMINE-DEXTROAMPHET ER 30 MG PO CP24: 30.0000 mg | ORAL_CAPSULE | Freq: Every day | ORAL | 0 refills | Status: DC

## 2021-01-14 ENCOUNTER — Other Ambulatory Visit: Payer: Self-pay

## 2021-01-14 ENCOUNTER — Encounter: Payer: Self-pay | Admitting: Family Medicine

## 2021-01-14 ENCOUNTER — Ambulatory Visit: Payer: BC Managed Care – PPO | Admitting: Family Medicine

## 2021-01-14 VITALS — BP 122/84 | HR 74 | Temp 98.1°F | Ht 70.0 in | Wt 242.6 lb

## 2021-01-14 DIAGNOSIS — F988 Other specified behavioral and emotional disorders with onset usually occurring in childhood and adolescence: Secondary | ICD-10-CM

## 2021-01-14 DIAGNOSIS — E119 Type 2 diabetes mellitus without complications: Secondary | ICD-10-CM | POA: Diagnosis not present

## 2021-01-14 DIAGNOSIS — I10 Essential (primary) hypertension: Secondary | ICD-10-CM

## 2021-01-14 LAB — POCT GLYCOSYLATED HEMOGLOBIN (HGB A1C): Hemoglobin A1C: 6 % — AB (ref 4.0–5.6)

## 2021-01-14 MED ORDER — AMPHETAMINE-DEXTROAMPHETAMINE 20 MG PO TABS: 20.0000 mg | ORAL_TABLET | Freq: Every day | ORAL | 0 refills | Status: DC | PRN

## 2021-01-14 MED ORDER — AMPHETAMINE-DEXTROAMPHET ER 30 MG PO CP24: 30.0000 mg | ORAL_CAPSULE | Freq: Every day | ORAL | 0 refills | Status: DC

## 2021-01-14 MED ORDER — METFORMIN HCL ER 500 MG PO TB24: 500.0000 mg | ORAL_TABLET | Freq: Every day | ORAL | 3 refills | Status: DC

## 2021-01-14 NOTE — Patient Instructions (Signed)
Please return in 3 months for diabetes follow up  Stop all sweetened beverages.  Stop the metformin 500 bid and get the new extended release version: metformin XR 590m ONCE a day to see if you can tolerate it.   I've refilled the adderall and increased the afternoon dose to 266m   Please set up an appointment for a diabetic eye exam and have the results sent to me.   If you have any questions or concerns, please don't hesitate to send me a message via MyChart or call the office at [phone] you for visiting with [facility]! It's our pleasure caring for you.   Diabetes Mellitus and Nutrition, Adult When you have diabetes, or diabetes mellitus, it is very important to have healthy eating habits because your blood sugar (glucose) levels are greatly affected by what you eat and drink. Eating healthy foods in the right amounts, at about the same times every day, can help you:  Control your blood glucose.  Lower your risk of heart disease.  Improve your blood pressure.  Reach or maintain a healthy weight. What can affect my meal plan? Every person with diabetes is different, and each person has different needs for a meal plan. Your health care provider may recommend that you work with a dietitian to make a meal plan that is best for you. Your meal plan may vary depending on factors such as:  The calories you need.  The medicines you take.  Your weight.  Your blood glucose, blood pressure, and cholesterol levels.  Your activity level.  Other health conditions you have, such as heart or kidney disease. How do carbohydrates affect me? Carbohydrates, also called carbs, affect your blood glucose level more than any other type of food. Eating carbs naturally raises the amount of glucose in your blood. Carb counting is a method for keeping track of how many carbs you eat. Counting carbs is important to keep your blood glucose at a healthy level, especially if you use insulin or take  certain oral diabetes medicines. It is important to know how many carbs you can safely have in each meal. This is different for every person. Your dietitian can help you calculate how many carbs you should have at each meal and for each snack. How does alcohol affect me? Alcohol can cause a sudden decrease in blood glucose (hypoglycemia), especially if you use insulin or take certain oral diabetes medicines. Hypoglycemia can be a life-threatening condition. Symptoms of hypoglycemia, such as sleepiness, dizziness, and confusion, are similar to symptoms of having too much alcohol.  Do not drink alcohol if: ? Your health care provider tells you not to drink. ? You are pregnant, may be pregnant, or are planning to become pregnant.  If you drink alcohol: ? Do not drink on an empty stomach. ? Limit how much you use to:  0-1 drink a day for women.  0-2 drinks a day for men. ? Be aware of how much alcohol is in your drink. In the [place] one drink equals one 12 oz bottle of beer (355 mL), one 5 oz glass of wine (148 mL), or one 1 oz glass of hard liquor (44 mL). ? Keep yourself hydrated with water, diet soda, or unsweetened iced tea.  Keep in mind that regular soda, juice, and other mixers may contain a lot of sugar and must be counted as carbs. What are tips for following this plan? Reading food labels  Start by checking the serving size  on the "Nutrition Facts" label of packaged foods and drinks. The amount of calories, carbs, fats, and other nutrients listed on the label is based on one serving of the item. Many items contain more than one serving per package.  Check the total grams (g) of carbs in one serving. You can calculate the number of servings of carbs in one serving by dividing the total carbs by 15. For example, if a food has 30 g of total carbs per serving, it would be equal to 2 servings of carbs.  Check the number of grams (g) of saturated fats and trans fats in one serving. Choose  foods that have a low amount or none of these fats.  Check the number of milligrams (mg) of salt (sodium) in one serving. Most people should limit total sodium intake to less than 2,300 mg per day.  Always check the nutrition information of foods labeled as "low-fat" or "nonfat." These foods may be higher in added sugar or refined carbs and should be avoided.  Talk to your dietitian to identify your daily goals for nutrients listed on the label. Shopping  Avoid buying canned, pre-made, or processed foods. These foods tend to be high in fat, sodium, and added sugar.  Shop around the outside edge of the grocery store. This is where you will most often find fresh fruits and vegetables, bulk grains, fresh meats, and fresh dairy. Cooking  Use low-heat cooking methods, such as baking, instead of high-heat cooking methods like deep frying.  Cook using healthy oils, such as olive, canola, or sunflower oil.  Avoid cooking with butter, cream, or high-fat meats. Meal planning  Eat meals and snacks regularly, preferably at the same times every day. Avoid going long periods of time without eating.  Eat foods that are high in fiber, such as fresh fruits, vegetables, beans, and whole grains. Talk with your dietitian about how many servings of carbs you can eat at each meal.  Eat 4-6 oz (112-168 g) of lean protein each day, such as lean meat, chicken, fish, eggs, or tofu. One ounce (oz) of lean protein is equal to: ? 1 oz (28 g) of meat, chicken, or fish. ? 1 egg. ?  cup (62 g) of tofu.  Eat some foods each day that contain healthy fats, such as avocado, nuts, seeds, and fish.   What foods should I eat? Fruits Berries. Apples. Oranges. Peaches. Apricots. Plums. Grapes. Mango. Papaya. Pomegranate. Kiwi. Cherries. Vegetables Lettuce. Spinach. Leafy greens, including kale, chard, collard greens, and mustard greens. Beets. Cauliflower. Cabbage. Broccoli. Carrots. Green beans. Tomatoes. Peppers.  Onions. Cucumbers. Brussels sprouts. Grains Whole grains, such as whole-wheat or whole-grain bread, crackers, tortillas, cereal, and pasta. Unsweetened oatmeal. Quinoa. Brown or wild rice. Meats and other proteins Seafood. Poultry without skin. Lean cuts of poultry and beef. Tofu. Nuts. Seeds. Dairy Low-fat or fat-free dairy products such as milk, yogurt, and cheese. The items listed above may not be a complete list of foods and beverages you can eat. Contact a dietitian for more information. What foods should I avoid? Fruits Fruits canned with syrup. Vegetables Canned vegetables. Frozen vegetables with butter or cream sauce. Grains Refined white flour and flour products such as bread, pasta, snack foods, and cereals. Avoid all processed foods. Meats and other proteins Fatty cuts of meat. Poultry with skin. Breaded or fried meats. Processed meat. Avoid saturated fats. Dairy Full-fat yogurt, cheese, or milk. Beverages Sweetened drinks, such as soda or iced tea. The items listed above  may not be a complete list of foods and beverages you should avoid. Contact a dietitian for more information. Questions to ask a health care provider  Do I need to meet with a diabetes educator?  Do I need to meet with a dietitian?  What number can I call if I have questions?  When are the best times to check my blood glucose? Where to find more information:  American Diabetes Association: diabetes.org  Academy of Nutrition and Dietetics: www.eatright.[employer] of Diabetes and Digestive and Kidney Diseases: [employer]  Association of Diabetes Care and Education Specialists: www.diabeteseducator.org Summary  It is important to have healthy eating habits because your blood sugar (glucose) levels are greatly affected by what you eat and drink.  A healthy meal plan will help you control your blood glucose and maintain a healthy lifestyle.  Your health care provider may  recommend that you work with a dietitian to make a meal plan that is best for you.  Keep in mind that carbohydrates (carbs) and alcohol have immediate effects on your blood glucose levels. It is important to count carbs and to use alcohol carefully. This information is not intended to replace advice given to you by your health care provider. Make sure you discuss any questions you have with your health care provider. Document Revised: 07/24/2019 Document Reviewed: 07/24/2019 Elsevier Patient Education  2021 [employer].

## 2021-01-14 NOTE — Progress Notes (Signed)
Subjective    CC:  Chief Complaint  Patient presents with  . Hypertension  . Diabetes  . ADD    HPI: George Campbell is a 50 y.o. male who presents to the office today for follow up of diabetes and problems listed above in the chief complaint.   New onset diabetes follow up: Restart metformin 500 twice daily 3 months ago.  He is taking it regularly and is considerably improved his diet.  No longer drinking as much soda.  He reports he was eating a lot of candy as well.  Unfortunately, having diarrhea once or twice daily with the metformin with some abdominal cramping.  He denies symptoms of hyper glycemia. He denies exertional CP or SOB or symptomatic hypoglycemia. He denies foot sores or paresthesias.  He is due an eye exam.  Immunizations are up-to-date.  He Is on a ACE inhibitor.  His LDL was 40, not taking a statin.  His weight is down almost 20 pounds.  Hypertension: Continues amlodipine 5 mg daily and lisinopril HCTZ high dose.  Does well.  No chest pain or shortness of breath.  Normal renal function.  ADD remains well controlled on twice daily dosing.  However having some breakthrough focus problems in the late evening.  He does work long shifts.  He also thinks that if he raises that dose it would help with evening, nighttime food cravings.  He has no problems with sleep.I reviewed patient's records from the PMP aware controlled substance registry today.   Wt Readings from Last 3 Encounters:  01/14/21 242 lb 9.6 oz (110 kg)  10/09/20 260 lb 9.6 oz (118.2 kg)  07/04/20 256 lb 1.6 oz (116.2 kg)    BP Readings from Last 3 Encounters:  01/14/21 122/84  10/09/20 118/86  07/04/20 120/86    Assessment  1. Type 2 diabetes mellitus without complication, without long-term current use of insulin ([place])   2. Essential hypertension   3. Attention deficit disorder (ADD) without hyperactivity      Plan   Diabetes is currently very well controlled.  Having side effects from metformin  twice daily.  Will change to metformin XR 500 daily and recheck in 3 months.  Continue healthy diet.  Stop sweetened beverages.  Education given.  Recommend eye exam.  Hypertension remains well controlled on lisinopril HCTZ and amlodipine.  No changes made today.  ADD: Mostly well controlled but will increase Adderall evening dose to 20 mg daily.  Continue Adderall 30 mg XR version in the morning.  Refilled for the next 3 months  Follow up: 3 months to recheck diabetes. Orders Placed This Encounter  Procedures  . POCT HgB A1C   Meds ordered this encounter  Medications  . amphetamine-dextroamphetamine (ADDERALL XR) 30 MG 24 hr capsule    Sig: Take 1 capsule (30 mg total) by mouth daily.    Dispense:  30 capsule    Refill:  0  . amphetamine-dextroamphetamine (ADDERALL XR) 30 MG 24 hr capsule    Sig: Take 1 capsule (30 mg total) by mouth daily.    Dispense:  30 capsule    Refill:  0  . amphetamine-dextroamphetamine (ADDERALL XR) 30 MG 24 hr capsule    Sig: Take 1 capsule (30 mg total) by mouth daily.    Dispense:  30 capsule    Refill:  0  . amphetamine-dextroamphetamine (ADDERALL) 20 MG tablet    Sig: Take 1 tablet (20 mg total) by mouth daily as needed.  Dispense:  30 tablet    Refill:  0  . metFORMIN (GLUCOPHAGE XR) 500 MG 24 hr tablet    Sig: Take 1 tablet (500 mg total) by mouth daily with breakfast.    Dispense:  90 tablet    Refill:  3    Stopping metformin 500 bid. Can cancel those refills. Thanks.  [name] amphetamine-dextroamphetamine (ADDERALL) 20 MG tablet    Sig: Take 1 tablet (20 mg total) by mouth daily as needed.    Dispense:  30 tablet    Refill:  0  . amphetamine-dextroamphetamine (ADDERALL) 20 MG tablet    Sig: Take 1 tablet (20 mg total) by mouth daily as needed.    Dispense:  30 tablet    Refill:  0      Immunization History  Administered Date(s) Administered  . Hepatitis B, adult 06/21/2018, 07/24/2018  . Influenza Inj Mdck Quad Pf 06/10/2017, 06/04/2019   . Influenza Nasal 05/15/2013, 05/27/2014  . Influenza, Quadrivalent, Recombinant, Inj, Pf 06/10/2017  . Influenza, Seasonal, Injecte, Preservative Fre 06/02/2015, 05/31/2016  . Influenza,inj,Quad PF,6+ Mos 06/21/2018, 05/02/2020  . Influenza-Unspecified 06/21/2018, 06/04/2019  . PFIZER(Purple Top)SARS-COV-2 Vaccination 10/25/2019, 11/22/2019, 05/29/2020  . Pneumococcal Polysaccharide-23 09/14/2016  . Tdap 09/06/2014, 06/10/2019    Diabetes Related Lab Review: Lab Results  Component Value Date   HGBA1C 6.0 (A) 01/14/2021   HGBA1C 8.3 (H) 10/09/2020   HGBA1C 6.2 (A) 05/02/2020    No results found for: George Campbell Lab Results  Component Value Date   CREATININE 0.78 10/09/2020   BUN 10 10/09/2020   NA 135 10/09/2020   K 4.2 10/09/2020   CL 97 10/09/2020   CO2 30 10/09/2020   Lab Results  Component Value Date   CHOL 108 10/09/2020   CHOL 137 10/09/2019   CHOL 146 09/19/2018   Lab Results  Component Value Date   HDL 31.00 (L) 10/09/2020   HDL 43.20 10/09/2019   HDL 54.70 09/19/2018   Lab Results  Component Value Date   LDLCALC 41 10/09/2020   LDLCALC 63 10/09/2019   LDLCALC 64 09/19/2018   Lab Results  Component Value Date   TRIG 179.0 (H) 10/09/2020   TRIG 152.0 (H) 10/09/2019   TRIG 135.0 09/19/2018   Lab Results  Component Value Date   CHOLHDL 3 10/09/2020   CHOLHDL 3 10/09/2019   CHOLHDL 3 09/19/2018   No results found for: LDLDIRECT The ASCVD Risk score George Campbell., et al., 2013) failed to calculate for the following reasons:   The valid total cholesterol range is 130 to 320 mg/dL I have reviewed the PMH, Fam and Soc history. Patient Active Problem List   Diagnosis Date Noted  . Obesity (BMI 30-39.9) 10/09/2020  . Alcoholism in recovery ([place]) 11/24/2017  . Fatty liver, alcoholic 28/41/3244    Liver ultrasound 2017, [place].   . Type 2 diabetes mellitus without complication, without long-term current use of insulin ([place]) 03/25/2015  . Class 1  obesity due to excess calories with serious comorbidity and body mass index (BMI) of 34.0 to 34.9 in adult 08/02/2013  . Essential hypertension 08/02/2013  . Attention deficit disorder (ADD) without hyperactivity 04/17/2013    Overview:  Has used adderall in past - works well. 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     Social History: Patient  reports that he quit smoking about 5 years ago. His smoking use included cigarettes. He has a 45.00 pack-year smoking history. He has never used smokeless  tobacco. He reports previous alcohol use. He reports current drug use. Drug: Marijuana.  Review of Systems: Ophthalmic: negative for eye pain, loss of vision or double vision Cardiovascular: negative for chest pain Respiratory: negative for SOB or persistent cough Gastrointestinal: negative for abdominal pain Genitourinary: negative for dysuria or gross hematuria MSK: negative for foot lesions Neurologic: negative for weakness or gait disturbance  Objective  Vitals: BP 122/84   Pulse 74   Temp 98.1 F (36.7 C) (Temporal)   Ht 5' 10"  (1.778 m)   Wt 242 lb 9.6 oz (110 kg)   SpO2 98%   BMI 34.81 kg/m  General: well appearing, no acute distress  Cardiovascular:  Nl S1 and S2, RRR without murmur, gallop or rub. no edema Respiratory:  Good breath sounds bilaterally, CTAB with normal effort, no rales Foot exam: no erythema, pallor, or cyanosis visible nl proprioception and sensation to monofilament testing bilaterally, +2 distal pulses bilaterally    Diabetic education: ongoing education regarding chronic disease management for diabetes was given today. We continue to reinforce the ABC's of diabetic management: A1c (<7 or 8 dependent upon patient), tight blood pressure control, and cholesterol management with goal LDL < 100 minimally. We discuss diet strategies, exercise recommendations, medication options and possible side effects. At each visit, we review recommended  immunizations and preventive care recommendations for diabetics and stress that good diabetic control can prevent other problems. See below for this patient's data.    Commons side effects, risks, benefits, and alternatives for medications and treatment plan prescribed today were discussed, and the patient expressed understanding of the given instructions. Patient is instructed to call or message via MyChart if he/she has any questions or concerns regarding our treatment plan. No barriers to understanding were identified. We discussed Red Flag symptoms and signs in detail. Patient expressed understanding regarding what to do in case of urgent or emergency type symptoms.   Medication list was reconciled, printed and provided to the patient in AVS. Patient instructions and summary information was reviewed with the patient as documented in the AVS. This note was prepared with assistance of Dragon voice recognition software. Occasional wrong-word or sound-a-like substitutions may have occurred due to the inherent limitations of voice recognition software  This visit occurred during the SARS-CoV-2 public health emergency.  Safety protocols were in place, including screening questions prior to the visit, additional usage of staff PPE, and extensive cleaning of exam room while observing appropriate contact time as indicated for disinfecting solutions.

## 2021-02-25 ENCOUNTER — Other Ambulatory Visit: Payer: Self-pay | Admitting: Family Medicine

## 2021-02-26 MED ORDER — DICLOFENAC SODIUM 75 MG PO TBEC: 75.0000 mg | DELAYED_RELEASE_TABLET | Freq: Two times a day (BID) | ORAL | 0 refills | Status: DC

## 2021-04-09 ENCOUNTER — Ambulatory Visit: Payer: BC Managed Care – PPO | Admitting: Family Medicine

## 2021-04-09 ENCOUNTER — Other Ambulatory Visit: Payer: Self-pay

## 2021-04-09 ENCOUNTER — Encounter: Payer: Self-pay | Admitting: Family Medicine

## 2021-04-09 DIAGNOSIS — E669 Obesity, unspecified: Secondary | ICD-10-CM

## 2021-04-09 DIAGNOSIS — E1159 Type 2 diabetes mellitus with other circulatory complications: Secondary | ICD-10-CM | POA: Diagnosis not present

## 2021-04-09 DIAGNOSIS — I10 Essential (primary) hypertension: Secondary | ICD-10-CM | POA: Diagnosis not present

## 2021-04-09 DIAGNOSIS — F988 Other specified behavioral and emotional disorders with onset usually occurring in childhood and adolescence: Secondary | ICD-10-CM | POA: Diagnosis not present

## 2021-04-09 DIAGNOSIS — F1021 Alcohol dependence, in remission: Secondary | ICD-10-CM

## 2021-04-09 DIAGNOSIS — Z1211 Encounter for screening for malignant neoplasm of colon: Secondary | ICD-10-CM

## 2021-04-09 DIAGNOSIS — Z1212 Encounter for screening for malignant neoplasm of rectum: Secondary | ICD-10-CM

## 2021-04-09 DIAGNOSIS — I152 Hypertension secondary to endocrine disorders: Secondary | ICD-10-CM

## 2021-04-09 LAB — POCT GLYCOSYLATED HEMOGLOBIN (HGB A1C): Hemoglobin A1C: 5.6 % (ref 4.0–5.6)

## 2021-04-09 MED ORDER — AMPHETAMINE-DEXTROAMPHET ER 30 MG PO CP24: 30.0000 mg | ORAL_CAPSULE | Freq: Every day | ORAL | 0 refills | Status: DC

## 2021-04-09 MED ORDER — METFORMIN HCL ER 500 MG PO TB24: 500.0000 mg | ORAL_TABLET | Freq: Every day | ORAL | 3 refills | Status: DC

## 2021-04-09 MED ORDER — AMPHETAMINE-DEXTROAMPHETAMINE 20 MG PO TABS: 20.0000 mg | ORAL_TABLET | Freq: Every day | ORAL | 0 refills | Status: DC | PRN

## 2021-04-09 NOTE — Progress Notes (Signed)
Subjective  CC:  Chief Complaint  Patient presents with   Hypertension   Diabetes    Taking medication as prescribed     HPI: George Campbell is a 50 y.o. male who presents to the office today for follow up of diabetes and problems listed above in the chief complaint.  Diabetes follow up: His diabetic control is reported as Improved. He has stopped sodas and most sweets. Weight is down 20 pounds! Tolerating metformin xr 500 but helps to take with a meal.  He denies exertional CP or SOB or symptomatic hypoglycemia. He denies foot sores or paresthesias. He is due for an eye exam. He is not on a statin due to exceptional lipid levels, genetic. On ace. Had pneumovax HTN: well controlled on ace/hctz and amlodipine.Feeling well. Taking medications w/o adverse effects. No symptoms of CHF, angina; no palpitations, sob, cp or lower extremity edema. Compliant with meds.  ADD: we increased afternoon dose adderall from 10-20; no Aes. Sleeps well. Improved focus w/o breakthrough symptoms. Morning dose of xr 48 works well.  Remains alcohol free x 2 years now. Bmi down to 31 now.  HM: due colorectal ca screen  Wt Readings from Last 3 Encounters:  04/09/21 218 lb (98.9 kg)  01/14/21 242 lb 9.6 oz (110 kg)  10/09/20 260 lb 9.6 oz (118.2 kg)    BP Readings from Last 3 Encounters:  04/09/21 117/78  01/14/21 122/84  10/09/20 118/86    Assessment  1. Hypertension associated with diabetes ([place])   2. Essential hypertension   3. Attention deficit disorder (ADD) without hyperactivity   4. Alcoholism in recovery ([place])   5. Obesity (BMI 30-39.9)   6. Screening for colorectal cancer      Plan  Diabetes is currently very well controlled. Will continue met xr 500 daily; recheck 3 months. May be able to stop it if a1c remains < 6.0. continue diet. Praised. Pt to schedule eye exam HTN: well controlled. Amlodipine 10 zestroetic 20/12.5 daily.  ADD: good control with changes. Cont adderall 20 q afternoon and  adderall xr 30 q am. I reviewed patient's records from the PMP aware controlled substance registry today.  Counseling regarding crc screen options; pt elects cologuard. He is average risk.  Obesity: contineu weight loss and diet.   Follow up: 31moof dm recheck. Orders Placed This Encounter  Procedures   Cologuard   POCT glycosylated hemoglobin (Hb A1C)    Immunization History  Administered Date(s) Administered   Hepatitis B, adult 06/21/2018, 07/24/2018   Influenza Inj Mdck Quad Pf 06/10/2017, 06/04/2019   Influenza Nasal 05/15/2013, 05/27/2014   Influenza, Quadrivalent, Recombinant, Inj, Pf 06/10/2017   Influenza, Seasonal, Injecte, Preservative Fre 06/02/2015, 05/31/2016   Influenza,inj,Quad PF,6+ Mos 06/21/2018, 05/02/2020   Influenza-Unspecified 06/21/2018, 06/04/2019   PFIZER(Purple Top)SARS-COV-2 Vaccination 10/25/2019, 11/22/2019, 05/29/2020   Pneumococcal Polysaccharide-23 09/14/2016   Tdap 09/06/2014, 06/10/2019    Diabetes Related Lab Review: Lab Results  Component Value Date   HGBA1C 5.6 04/09/2021   HGBA1C 6.0 (A) 01/14/2021   HGBA1C 8.3 (H) 10/09/2020    No results found for: MDerl BarrowLab Results  Component Value Date   CREATININE 0.78 10/09/2020   BUN 10 10/09/2020   NA 135 10/09/2020   K 4.2 10/09/2020   CL 97 10/09/2020   CO2 30 10/09/2020   Lab Results  Component Value Date   CHOL 108 10/09/2020   CHOL 137 10/09/2019   CHOL 146 09/19/2018   Lab Results  Component Value Date  HDL 31.00 (L) 10/09/2020   HDL 43.20 10/09/2019   HDL 54.70 09/19/2018   Lab Results  Component Value Date   LDLCALC 41 10/09/2020   LDLCALC 63 10/09/2019   LDLCALC 64 09/19/2018   Lab Results  Component Value Date   TRIG 179.0 (H) 10/09/2020   TRIG 152.0 (H) 10/09/2019   TRIG 135.0 09/19/2018   Lab Results  Component Value Date   CHOLHDL 3 10/09/2020   CHOLHDL 3 10/09/2019   CHOLHDL 3 09/19/2018   No results found for: LDLDIRECT The ASCVD  Risk score [name], et al., 2013) failed to calculate for the following reasons:   The valid total cholesterol range is 130 to 320 mg/dL I have reviewed the PMH, Fam and Soc history. Patient Active Problem List   Diagnosis Date Noted   Obesity (BMI 30-39.9) 10/09/2020   Alcoholism in recovery ([place]) 11/24/2017   Fatty liver, alcoholic 50/38/8828    Liver ultrasound 2017, [place].    Hypertension associated with type 2 diabetes mellitus ([place]) 03/25/2015    Started metformin 09/2020; no statin due to excellent LDL < 60 naturally.    Class 1 obesity due to excess calories with serious comorbidity and body mass index (BMI) of 34.0 to 34.9 in adult 08/02/2013   Essential hypertension 08/02/2013   Attention deficit disorder (ADD) without hyperactivity 04/17/2013    Overview:  Has used adderall in past - works well. 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     Social History: Patient  reports that he quit smoking about 5 years ago. His smoking use included cigarettes. He has a 45.00 pack-year smoking history. He has never used smokeless tobacco. He reports that he does not currently use alcohol. He reports current drug use. Drug: Marijuana.  Review of Systems: Ophthalmic: negative for eye pain, loss of vision or double vision Cardiovascular: negative for chest pain Respiratory: negative for SOB or persistent cough Gastrointestinal: negative for abdominal pain Genitourinary: negative for dysuria or gross hematuria MSK: negative for foot lesions Neurologic: negative for weakness or gait disturbance  Objective  Vitals: BP 117/78   Pulse 94   Temp 97.8 F (36.6 C) (Temporal)   Ht 5' 10"  (1.778 m)   Wt 218 lb (98.9 kg)   SpO2 100%   BMI 31.28 kg/m  General: well appearing, no acute distress  Psych:  Alert and oriented, normal mood and affect Cardiovascular:  Nl S1 and S2, RRR without murmur, gallop or rub. no edema   Diabetic education: ongoing education  regarding chronic disease management for diabetes was given today. We continue to reinforce the ABC's of diabetic management: A1c (<7 or 8 dependent upon patient), tight blood pressure control, and cholesterol management with goal LDL < 100 minimally. We discuss diet strategies, exercise recommendations, medication options and possible side effects. At each visit, we review recommended immunizations and preventive care recommendations for diabetics and stress that good diabetic control can prevent other problems. See below for this patient's data.   Commons side effects, risks, benefits, and alternatives for medications and treatment plan prescribed today were discussed, and the patient expressed understanding of the given instructions. Patient is instructed to call or message via MyChart if he/she has any questions or concerns regarding our treatment plan. No barriers to understanding were identified. We discussed Red Flag symptoms and signs in detail. Patient expressed understanding regarding what to do in case of urgent or emergency type symptoms.  Medication list was reconciled, printed  and provided to the patient in AVS. Patient instructions and summary information was reviewed with the patient as documented in the AVS. This note was prepared with assistance of Dragon voice recognition software. Occasional wrong-word or sound-a-like substitutions may have occurred due to the inherent limitations of voice recognition software  This visit occurred during the SARS-CoV-2 public health emergency.  Safety protocols were in place, including screening questions prior to the visit, additional usage of staff PPE, and extensive cleaning of exam room while observing appropriate contact time as indicated for disinfecting solutions.

## 2021-04-09 NOTE — Patient Instructions (Signed)
Please return in 3 months for diabetes follow up    Please set up an appointment for a diabetic eye exam and have the results sent to me.   I have refilled your ADD medications for the next 3 months.   Excellent work with improving your diet, diabetes and weight loss.  If you have any questions or concerns, please don't hesitate to send me a message via MyChart or call the office at [phone]. Thank you for visiting with George Campbell today! It's our pleasure caring for you.

## 2021-04-21 DIAGNOSIS — Z1212 Encounter for screening for malignant neoplasm of rectum: Secondary | ICD-10-CM | POA: Diagnosis not present

## 2021-04-21 DIAGNOSIS — Z1211 Encounter for screening for malignant neoplasm of colon: Secondary | ICD-10-CM | POA: Diagnosis not present

## 2021-04-27 ENCOUNTER — Encounter: Payer: Self-pay | Admitting: Family Medicine

## 2021-04-27 LAB — COLOGUARD: Cologuard: NEGATIVE

## 2021-04-28 ENCOUNTER — Ambulatory Visit: Payer: BC Managed Care – PPO | Admitting: Family Medicine

## 2021-07-17 ENCOUNTER — Encounter: Payer: Self-pay | Admitting: Family Medicine

## 2021-07-19 ENCOUNTER — Other Ambulatory Visit: Payer: Self-pay | Admitting: Family Medicine

## 2021-07-20 ENCOUNTER — Other Ambulatory Visit: Payer: Self-pay

## 2021-07-20 MED ORDER — AMPHETAMINE-DEXTROAMPHETAMINE 20 MG PO TABS: 20.0000 mg | ORAL_TABLET | Freq: Every day | ORAL | 0 refills | Status: DC | PRN

## 2021-07-20 NOTE — Telephone Encounter (Signed)
Patient is scheduled, refill request sent to PCP

## 2021-07-20 NOTE — Telephone Encounter (Signed)
LAST APPOINTMENT DATE: 04/09/2021   NEXT APPOINTMENT DATE: Visit date not found    LAST REFILL:04/09/2021

## 2021-07-20 NOTE — Progress Notes (Unsigned)
Last OV 04/09/2021 dx hypertension associated with diabetes Last Refill 06/08/2021   Patient is scheduled for 08/05/2021

## 2021-08-02 ENCOUNTER — Other Ambulatory Visit: Payer: Self-pay | Admitting: Family Medicine

## 2021-08-05 ENCOUNTER — Ambulatory Visit: Payer: BC Managed Care – PPO | Admitting: Family Medicine

## 2021-08-05 ENCOUNTER — Encounter: Payer: Self-pay | Admitting: Family Medicine

## 2021-08-05 VITALS — BP 120/82 | HR 92 | Temp 98.0°F | Ht 70.0 in | Wt 194.8 lb

## 2021-08-05 DIAGNOSIS — F988 Other specified behavioral and emotional disorders with onset usually occurring in childhood and adolescence: Secondary | ICD-10-CM

## 2021-08-05 DIAGNOSIS — Z23 Encounter for immunization: Secondary | ICD-10-CM | POA: Diagnosis not present

## 2021-08-05 DIAGNOSIS — E1159 Type 2 diabetes mellitus with other circulatory complications: Secondary | ICD-10-CM

## 2021-08-05 DIAGNOSIS — I152 Hypertension secondary to endocrine disorders: Secondary | ICD-10-CM | POA: Diagnosis not present

## 2021-08-05 DIAGNOSIS — E119 Type 2 diabetes mellitus without complications: Secondary | ICD-10-CM | POA: Insufficient documentation

## 2021-08-05 LAB — POCT GLYCOSYLATED HEMOGLOBIN (HGB A1C): Hemoglobin A1C: 5.6 % (ref 4.0–5.6)

## 2021-08-05 MED ORDER — AMPHETAMINE-DEXTROAMPHETAMINE 20 MG PO TABS: 20.0000 mg | ORAL_TABLET | Freq: Every day | ORAL | 0 refills | Status: DC | PRN

## 2021-08-05 MED ORDER — AMPHETAMINE-DEXTROAMPHET ER 30 MG PO CP24: 30.0000 mg | ORAL_CAPSULE | Freq: Every day | ORAL | 0 refills | Status: DC

## 2021-08-05 NOTE — Addendum Note (Signed)
Addended by: Gean Birchwood on: 08/05/2021 02:19 PM   Modules accepted: Orders

## 2021-08-05 NOTE — Patient Instructions (Signed)
Please return in 3 months for your annual complete physical; please come fasting.   Today you were given Prevnar 13 to protect against pneumococcal pneumonia infections. Congrats on your health achievements!  If you have any questions or concerns, please don't hesitate to send me a message via MyChart or call the office at [phone]. Thank you for visiting with [place] today! It's our pleasure caring for you.

## 2021-08-05 NOTE — Progress Notes (Signed)
Subjective  CC:  Chief Complaint  Patient presents with   ADD   Diabetes   Hypertension    HPI: George Campbell is a 50 y.o. male who presents to the office today for follow up of diabetes and problems listed above in the chief complaint.  Diabetes follow up: His diabetic control is reported as Improved.  Has done amazingly well with weight loss. He is no longer in the obese category.  He feels great.  He has more energy.  He is more active.  Diet is now healthy.  He is given up all sodas.  Stopped metformin about 2 months ago due to GI upset.  He denies exertional CP or SOB or symptomatic hypoglycemia. He denies foot sores or paresthesias.  ADD remains well controlled.  Intermittently uses afternoon dose.I reviewed patient's records from the PMP aware controlled substance registry today.  Hypertension remains well controlled on amlodipine, ACE and HCTZ.  No chest pain or shortness of breath.  Wt Readings from Last 3 Encounters:  08/05/21 194 lb 12.8 oz (88.4 kg)  04/09/21 218 lb (98.9 kg)  01/14/21 242 lb 9.6 oz (110 kg)    BP Readings from Last 3 Encounters:  08/05/21 120/82  04/09/21 117/78  01/14/21 122/84    Assessment  1. Type 2 diabetes mellitus without complication, without long-term current use of insulin ([place])   2. Hypertension associated with type 2 diabetes mellitus ([place])   3. Attention deficit disorder (ADD) without hyperactivity      Plan  Diabetes is currently very well controlled.  Now diet controlled.  Praised for changes.  Continue healthy diet and exercise.  Updated Prevnar 13 today. ADD remains well controlled.  Continue Adderall 30 mg every morning and 20 mg in the afternoon if needed.  Told to monitor for side effects.  Given weight loss, we may be able to lessen his dose.  Follow-up in 3 months Hypertension remains controlled.  No change in medications today. Prevnar 13 given today.  Pneumococcal vaccinations are now complete  Follow up: 3 months for  complete physical and follow-up on diabetes hypertension and ADD.. Orders Placed This Encounter  Procedures   POCT HgB A1C   No orders of the defined types were placed in this encounter.     Immunization History  Administered Date(s) Administered   Hepatitis B, adult 06/21/2018, 07/24/2018   Influenza Inj Mdck Quad Pf 06/10/2017, 06/04/2019   Influenza Nasal 05/15/2013, 05/27/2014   Influenza, Quadrivalent, Recombinant, Inj, Pf 06/10/2017   Influenza, Seasonal, Injecte, Preservative Fre 06/02/2015, 05/31/2016   Influenza,inj,Quad PF,6+ Mos 06/21/2018, 05/02/2020   Influenza-Unspecified 06/21/2018, 06/04/2019, 07/14/2021   PFIZER(Purple Top)SARS-COV-2 Vaccination 10/25/2019, 11/22/2019, 05/29/2020   Pneumococcal Polysaccharide-23 09/14/2016   Tdap 09/06/2014, 06/10/2019    Diabetes Related Lab Review: Lab Results  Component Value Date   HGBA1C 5.6 08/05/2021   HGBA1C 5.6 04/09/2021   HGBA1C 6.0 (A) 01/14/2021    No results found for: Derl Barrow Lab Results  Component Value Date   CREATININE 0.78 10/09/2020   BUN 10 10/09/2020   NA 135 10/09/2020   K 4.2 10/09/2020   CL 97 10/09/2020   CO2 30 10/09/2020   Lab Results  Component Value Date   CHOL 108 10/09/2020   CHOL 137 10/09/2019   CHOL 146 09/19/2018   Lab Results  Component Value Date   HDL 31.00 (L) 10/09/2020   HDL 43.20 10/09/2019   HDL 54.70 09/19/2018   Lab Results  Component Value Date  LDLCALC 41 10/09/2020   LDLCALC 63 10/09/2019   LDLCALC 64 09/19/2018   Lab Results  Component Value Date   TRIG 179.0 (H) 10/09/2020   TRIG 152.0 (H) 10/09/2019   TRIG 135.0 09/19/2018   Lab Results  Component Value Date   CHOLHDL 3 10/09/2020   CHOLHDL 3 10/09/2019   CHOLHDL 3 09/19/2018   No results found for: LDLDIRECT The ASCVD Risk score (Arnett DK, et al., 2019) failed to calculate for the following reasons:   The valid total cholesterol range is 130 to 320 mg/dL I have reviewed the  PMH, Fam and Soc history. Patient Active Problem List   Diagnosis Date Noted   Type 2 diabetes mellitus without complication, without long-term current use of insulin ([place]) 08/05/2021   Alcoholism in recovery ([place]) 11/24/2017   Fatty liver, alcoholic 29/93/7169    Liver ultrasound 2017, [place].    Hypertension associated with type 2 diabetes mellitus ([place]) 03/25/2015    Started metformin 09/2020; no statin due to excellent LDL < 60 naturally. Stopped met 06/2021, diet controlled    Class 1 obesity due to excess calories with serious comorbidity and body mass index (BMI) of 34.0 to 34.9 in adult 08/02/2013   Attention deficit disorder (ADD) without hyperactivity 04/17/2013    Overview:  Has used adderall in past - works well. 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     Social History: Patient  reports that he quit smoking about 5 years ago. His smoking use included cigarettes. He has a 45.00 pack-year smoking history. He has never used smokeless tobacco. He reports that he does not currently use alcohol. He reports current drug use. Drug: Marijuana.  Review of Systems: Ophthalmic: negative for eye pain, loss of vision or double vision Cardiovascular: negative for chest pain Respiratory: negative for SOB or persistent cough Gastrointestinal: negative for abdominal pain Genitourinary: negative for dysuria or gross hematuria MSK: negative for foot lesions Neurologic: negative for weakness or gait disturbance  Objective  Vitals: BP 120/82   Pulse 92   Temp 98 F (36.7 C) (Temporal)   Ht _0  (1.778 m)   Wt 194 lb 12.8 oz (88.4 kg)   SpO2 96%   BMI 27.95 kg/m  General: well appearing, no acute distress  Psych:  Alert and oriented, normal mood and affect    Diabetic education: ongoing education regarding chronic disease management for diabetes was given today. We continue to reinforce the ABC's of diabetic management: A1c (<7 or 8 dependent upon patient),  tight blood pressure control, and cholesterol management with goal LDL < 100 minimally. We discuss diet strategies, exercise recommendations, medication options and possible side effects. At each visit, we review recommended immunizations and preventive care recommendations for diabetics and stress that good diabetic control can prevent other problems. See below for this patient's data.   Commons side effects, risks, benefits, and alternatives for medications and treatment plan prescribed today were discussed, and the patient expressed understanding of the given instructions. Patient is instructed to call or message via MyChart if he/she has any questions or concerns regarding our treatment plan. No barriers to understanding were identified. We discussed Red Flag symptoms and signs in detail. Patient expressed understanding regarding what to do in case of urgent or emergency type symptoms.  Medication list was reconciled, printed and provided to the patient in AVS. Patient instructions and summary information was reviewed with the patient as documented in the AVS. This note was prepared  with assistance of Systems analyst. Occasional wrong-word or sound-a-like substitutions may have occurred due to the inherent limitations of voice recognition software  This visit occurred during the SARS-CoV-2 public health emergency.  Safety protocols were in place, including screening questions prior to the visit, additional usage of staff PPE, and extensive cleaning of exam room while observing appropriate contact time as indicated for disinfecting solutions.

## 2021-08-11 ENCOUNTER — Other Ambulatory Visit: Payer: Self-pay | Admitting: Family Medicine

## 2021-08-12 ENCOUNTER — Ambulatory Visit: Payer: BC Managed Care – PPO | Admitting: Family Medicine

## 2021-10-05 ENCOUNTER — Encounter: Payer: Self-pay | Admitting: Family Medicine

## 2021-10-05 MED ORDER — AMPHETAMINE-DEXTROAMPHET ER 20 MG PO CP24: 20.0000 mg | ORAL_CAPSULE | ORAL | 0 refills | Status: DC

## 2021-11-03 ENCOUNTER — Encounter: Payer: Self-pay | Admitting: Family Medicine

## 2021-11-03 ENCOUNTER — Ambulatory Visit (INDEPENDENT_AMBULATORY_CARE_PROVIDER_SITE_OTHER): Payer: BC Managed Care – PPO | Admitting: Family Medicine

## 2021-11-03 VITALS — BP 130/86 | HR 73 | Temp 98.0°F | Ht 70.0 in | Wt 191.8 lb

## 2021-11-03 DIAGNOSIS — E1159 Type 2 diabetes mellitus with other circulatory complications: Secondary | ICD-10-CM

## 2021-11-03 DIAGNOSIS — K700 Alcoholic fatty liver: Secondary | ICD-10-CM

## 2021-11-03 DIAGNOSIS — I10 Essential (primary) hypertension: Secondary | ICD-10-CM | POA: Diagnosis not present

## 2021-11-03 DIAGNOSIS — E119 Type 2 diabetes mellitus without complications: Secondary | ICD-10-CM

## 2021-11-03 DIAGNOSIS — F988 Other specified behavioral and emotional disorders with onset usually occurring in childhood and adolescence: Secondary | ICD-10-CM | POA: Diagnosis not present

## 2021-11-03 DIAGNOSIS — F1021 Alcohol dependence, in remission: Secondary | ICD-10-CM | POA: Diagnosis not present

## 2021-11-03 DIAGNOSIS — Z0000 Encounter for general adult medical examination without abnormal findings: Secondary | ICD-10-CM | POA: Diagnosis not present

## 2021-11-03 LAB — CBC WITH DIFFERENTIAL/PLATELET
Basophils Absolute: 0 10*3/uL (ref 0.0–0.1)
Basophils Relative: 0.5 % (ref 0.0–3.0)
Eosinophils Absolute: 0.1 10*3/uL (ref 0.0–0.7)
Eosinophils Relative: 1.9 % (ref 0.0–5.0)
HCT: 40.6 % (ref 39.0–52.0)
Hemoglobin: 14 g/dL (ref 13.0–17.0)
Lymphocytes Relative: 30.4 % (ref 12.0–46.0)
Lymphs Abs: 1.8 10*3/uL (ref 0.7–4.0)
MCHC: 34.5 g/dL (ref 30.0–36.0)
MCV: 85.5 fl (ref 78.0–100.0)
Monocytes Absolute: 0.7 10*3/uL (ref 0.1–1.0)
Monocytes Relative: 11.3 % (ref 3.0–12.0)
Neutro Abs: 3.2 10*3/uL (ref 1.4–7.7)
Neutrophils Relative %: 55.9 % (ref 43.0–77.0)
Platelets: 301 10*3/uL (ref 150.0–400.0)
RBC: 4.76 Mil/uL (ref 4.22–5.81)
RDW: 13.5 % (ref 11.5–15.5)
WBC: 5.7 10*3/uL (ref 4.0–10.5)

## 2021-11-03 LAB — COMPREHENSIVE METABOLIC PANEL
ALT: 18 U/L (ref 0–53)
AST: 19 U/L (ref 0–37)
Albumin: 4.5 g/dL (ref 3.5–5.2)
Alkaline Phosphatase: 58 U/L (ref 39–117)
BUN: 10 mg/dL (ref 6–23)
CO2: 31 mEq/L (ref 19–32)
Calcium: 9.7 mg/dL (ref 8.4–10.5)
Chloride: 102 mEq/L (ref 96–112)
Creatinine, Ser: 0.75 mg/dL (ref 0.40–1.50)
GFR: 106.33 mL/min (ref >60.00)
Glucose, Bld: 114 mg/dL — ABNORMAL HIGH (ref 70–99)
Potassium: 3.8 mEq/L (ref 3.5–5.1)
Sodium: 139 mEq/L (ref 135–145)
Total Bilirubin: 0.6 mg/dL (ref 0.2–1.2)
Total Protein: 7.1 g/dL (ref 6.0–8.3)

## 2021-11-03 LAB — MICROALBUMIN / CREATININE URINE RATIO
Creatinine,U: 66.1 mg/dL
Microalb Creat Ratio: 1.4 mg/g (ref 0.0–30.0)
Microalb, Ur: 0.9 mg/dL (ref 0.0–1.9)

## 2021-11-03 LAB — LIPID PANEL
Cholesterol: 118 mg/dL (ref 0–200)
HDL: 54.9 mg/dL (ref >39.00)
LDL Cholesterol: 53 mg/dL (ref 0–99)
NonHDL: 63.44
Total CHOL/HDL Ratio: 2
Triglycerides: 52 mg/dL (ref 0.0–149.0)
VLDL: 10.4 mg/dL (ref 0.0–40.0)

## 2021-11-03 LAB — TSH: TSH: 0.73 u[IU]/mL (ref 0.35–5.50)

## 2021-11-03 LAB — HEMOGLOBIN A1C: Hgb A1c MFr Bld: 5.9 % (ref 4.6–6.5)

## 2021-11-03 MED ORDER — AMPHETAMINE-DEXTROAMPHET ER 20 MG PO CP24: 20.0000 mg | ORAL_CAPSULE | Freq: Two times a day (BID) | ORAL | 0 refills | Status: DC

## 2021-11-03 NOTE — Patient Instructions (Signed)
Please return in 6 months for diabetes, bp and ADD recheck. ? ?I will release your lab results to you on your MyChart account with further instructions. You may see the results before I do, but when I review them I will send you a message with my report or have my assistant call you if things need to be discussed. Please reply to my message with any questions. Thank you!  ? ?I've sent in RX for the next 6 months for adderall xr 20 twice daily.  ? ?If you have any questions or concerns, please don't hesitate to send me a message via MyChart or call the office at [phone]. Thank you for visiting with [place] today! It's our pleasure caring for you.  ?

## 2021-11-03 NOTE — Progress Notes (Signed)
Subjective  Chief Complaint  Patient presents with   Annual Exam    No concerns today.   Diabetes   Hypertension    HPI: George Campbell is a 51 y.o. male who presents to [facility] today for a Male Wellness Visit. He also has the concerns and/or needs as listed above in the chief complaint. These will be addressed in addition to the Health Maintenance Visit.   Wellness Visit: annual visit with health maintenance review and exam   Health maintenance: Doing very well.  Continues to eat healthy.  Weight stable.  Physically exercises daily.  No mood problems.  Remains alcohol free.  No concerns.  Musicians are up-to-date.  Body mass index is 27.52 kg/m. Wt Readings from Last 3 Encounters:  11/03/21 191 lb 12.8 oz (87 kg)  08/05/21 194 lb 12.8 oz (88.4 kg)  04/09/21 218 lb (98.9 kg)     Chronic disease management visit and/or acute problem visit: Diet-controlled diabetes: No symptoms of hyperglycemia.  Due for eye exam. Hypertension: On antihypertensives.  No chest pain or shortness of breath.  No lower extremity edema. ADD: Doing well on Adderall 20 twice daily now.  No longer tolerate 30 mg dose.  Needs refills.  Patient Active Problem List   Diagnosis Date Noted   Type 2 diabetes mellitus without complication, without long-term current use of insulin ([place]) 08/05/2021   Alcoholism in recovery ([place]) 11/24/2017   Fatty liver, alcoholic 23/76/2831   Hypertension associated with type 2 diabetes mellitus ([place]) 03/25/2015   Class 1 obesity due to excess calories with serious comorbidity and body mass index (BMI) of 34.0 to 34.9 in adult 08/02/2013   Attention deficit disorder (ADD) without hyperactivity 04/17/2013   Health Maintenance  Topic Date Due   OPHTHALMOLOGY EXAM  Never done   COVID-19 Vaccine (5 - Booster for Pfizer series) 11/19/2021 (Originally 06/05/2021)   HEMOGLOBIN A1C  02/03/2022   FOOT EXAM  11/04/2022   Fecal DNA (Cologuard)  04/21/2024    TETANUS/TDAP  06/09/2029   INFLUENZA VACCINE  Completed   Hepatitis C Screening  Completed   HIV Screening  Completed   HPV VACCINES  Aged Out   Immunization History  Administered Date(s) Administered   Hepatitis B, adult 06/21/2018, 07/24/2018   Influenza Inj Mdck Quad Pf 06/10/2017, 06/04/2019   Influenza Nasal 05/15/2013, 05/27/2014   Influenza, Quadrivalent, Recombinant, Inj, Pf 06/10/2017   Influenza, Seasonal, Injecte, Preservative Fre 06/02/2015, 05/31/2016   Influenza,inj,Quad PF,6+ Mos 06/21/2018, 05/02/2020   Influenza-Unspecified 06/21/2018, 06/04/2019, 07/14/2021   PFIZER(Purple Top)SARS-COV-2 Vaccination 10/25/2019, 11/22/2019, 05/29/2020, 04/10/2021   Pneumococcal Conjugate-13 08/05/2021   Pneumococcal Polysaccharide-23 09/14/2016   Tdap 09/06/2014, 06/10/2019   We updated and reviewed the patient's past history in detail and it is documented below. Allergies: Patient is allergic to strattera [atomoxetine hcl]. Past Medical History  has a past medical history of Anxiety, Depression, Hypertension, Inguinal hernia, and Substance abuse ([place]) (02 2019). Past Surgical History Patient  has a past surgical history that includes Knee arthrodesis (Right, 2002); Mouth surgery; Hernia repair; and XI Robotic assisted inguinal hernia repair with mesh (Bilateral, 09/25/2019). Social History Patient  reports that he quit smoking about 5 years ago. His smoking use included cigarettes and e-cigarettes. He has a 45.00 pack-year smoking history. He has never used smokeless tobacco. He reports that he does not currently use alcohol. He reports current drug use. Drug: Marijuana. Family History family history includes ADD / ADHD in his mother; Alcohol abuse  in his father; Esophageal cancer in his maternal uncle; Healthy in his paternal uncle and son; Hypertension in his mother. Review of Systems: Constitutional: negative for fever or malaise Ophthalmic: negative for photophobia, double  vision or loss of vision Cardiovascular: negative for chest pain, dyspnea on exertion, or new LE swelling Respiratory: negative for SOB or persistent cough Gastrointestinal: negative for abdominal pain, change in bowel habits or melena Genitourinary: negative for dysuria or gross hematuria Musculoskeletal: negative for new gait disturbance or muscular weakness Integumentary: negative for new or persistent rashes Neurological: negative for TIA or stroke symptoms Psychiatric: negative for SI or delusions Allergic/Immunologic: negative for hives  Patient Care Team    Relationship Specialty Notifications Start End  Leamon Arnt, MD PCP - General Family Medicine  09/26/17   Clovis Riley, MD Consulting Physician General Surgery  09/26/19    Objective  Vitals: BP 130/86    Pulse 73    Temp 98 F (36.7 C) (Temporal)    Ht 5' 10"  (1.778 m)    Wt 191 lb 12.8 oz (87 kg)    SpO2 100%    BMI 27.52 kg/m  General:  Well developed, well nourished, no acute distress  Psych:  Alert and orientedx3,normal mood and affect HEENT:  Normocephalic, atraumatic, non-icteric sclera, PERRL, oropharynx is clear without mass or exudate, supple neck without adenopathy, mass or thyromegaly Cardiovascular:  Normal S1, S2, RRR without gallop, rub or murmur, nondisplaced PMI, +2 distal pulses in bilateral upper and lower extremities. Respiratory:  Good breath sounds bilaterally, CTAB with normal respiratory effort Gastrointestinal: normal bowel sounds, soft, non-tender, no noted masses. No HSM MSK: no deformities, contusions. Joints are without erythema or swelling. Spine and CVA region are nontender Skin:  Warm, no rashes or suspicious lesions noted Neurologic:    Mental status is normal. CN 2-11 are normal. Gross motor and sensory exams are normal. Stable gait. No tremor GU: No inguinal hernias or adenopathy are appreciated bilaterally   Assessment  1. Annual physical exam   2. Type 2 diabetes mellitus without  complication, without long-term current use of insulin ([place])   3. Attention deficit disorder (ADD) without hyperactivity   4. Hypertension associated with type 2 diabetes mellitus ([place])   5. Fatty liver, alcoholic   6. Alcoholism in recovery [facility])      Plan  Male Wellness Visit: Age appropriate Health Maintenance and Prevention measures were discussed with patient. Included topics are cancer screening recommendations, ways to keep healthy (see AVS) including dietary and exercise recommendations, regular eye and dental care, use of seat belts, and avoidance of moderate alcohol use and tobacco use.  BMI: discussed patient's BMI and encouraged positive lifestyle modifications to help get to or maintain a target BMI. HM needs and immunizations were addressed and ordered. See below for orders. See HM and immunization section for updates. Routine labs and screening tests ordered including cmp, cbc and lipids where appropriate. Discussed recommendations regarding Vit D and calcium supplementation (see AVS)  Chronic disease f/u and/or acute problem visit: (deemed necessary to be done in addition to the wellness visit): Diet-controlled diabetes: Recheck A1c today.  Check urine microalbuminuria.  Continue healthy diet.  Patient to schedule eye exam.  No history of retinopathy ADD: Doing well, now on Adderall XR 20 twice daily.  Decrease dose likely related to significant weight loss. Hypertension: Continue ACE inhibitor with HCTZ and amlodipine.  Check renal function electrolytes.  Has always had excellent cholesterol levels.  He is not on  a statin.  Follow up: 6 months for ADD, blood pressure and diabetes recheck Commons side effects, risks, benefits, and alternatives for medications and treatment plan prescribed today were discussed, and the patient expressed understanding of the given instructions. Patient is instructed to call or message via MyChart if he/she has any questions or concerns regarding  our treatment plan. No barriers to understanding were identified. We discussed Red Flag symptoms and signs in detail. Patient expressed understanding regarding what to do in case of urgent or emergency type symptoms.  Medication list was reconciled, printed and provided to the patient in AVS. Patient instructions and summary information was reviewed with the patient as documented in the AVS. This note was prepared with assistance of Dragon voice recognition software. Occasional wrong-word or sound-a-like substitutions may have occurred due to the inherent limitations of voice recognition software  This visit occurred during the SARS-CoV-2 public health emergency.  Safety protocols were in place, including screening questions prior to the visit, additional usage of staff PPE, and extensive cleaning of exam room while observing appropriate contact time as indicated for disinfecting solutions.   Orders Placed This Encounter  Procedures   CBC with Differential/Platelet   Comprehensive metabolic panel   Lipid panel   Hemoglobin A1c   TSH   Microalbumin / creatinine urine ratio   Meds ordered this encounter  Medications   amphetamine-dextroamphetamine (ADDERALL XR) 20 MG 24 hr capsule    Sig: Take 1 capsule (20 mg total) by mouth in the morning and at bedtime.    Dispense:  60 capsule    Refill:  0   amphetamine-dextroamphetamine (ADDERALL XR) 20 MG 24 hr capsule    Sig: Take 1 capsule (20 mg total) by mouth in the morning and at bedtime.    Dispense:  60 capsule    Refill:  0   amphetamine-dextroamphetamine (ADDERALL XR) 20 MG 24 hr capsule    Sig: Take 1 capsule (20 mg total) by mouth in the morning and at bedtime.    Dispense:  60 capsule    Refill:  0   amphetamine-dextroamphetamine (ADDERALL XR) 20 MG 24 hr capsule    Sig: Take 1 capsule (20 mg total) by mouth 2 (two) times daily.    Dispense:  60 capsule    Refill:  0   amphetamine-dextroamphetamine (ADDERALL XR) 20 MG 24 hr capsule     Sig: Take 1 capsule (20 mg total) by mouth in the morning and at bedtime.    Dispense:  60 capsule    Refill:  0   amphetamine-dextroamphetamine (ADDERALL XR) 20 MG 24 hr capsule    Sig: Take 1 capsule (20 mg total) by mouth in the morning and at bedtime.    Dispense:  60 capsule    Refill:  0

## 2021-11-16 ENCOUNTER — Other Ambulatory Visit: Payer: Self-pay | Admitting: Family Medicine

## 2021-12-02 ENCOUNTER — Telehealth: Payer: Self-pay

## 2021-12-02 ENCOUNTER — Encounter: Payer: Self-pay | Admitting: Family Medicine

## 2021-12-02 NOTE — Telephone Encounter (Signed)
Patient is requesting call at [phone] once sent.

## 2021-12-02 NOTE — Telephone Encounter (Signed)
Patient wants to know if Adderall 30 mg since the [facility] is out of what he usually takes. Patient states the do have the 61m at  ?[id number] [place], [place] [address]:  [phone] ?Fax:  [phone] ?  ? ? ?Patient would like a call back. ?

## 2021-12-03 MED ORDER — AMPHETAMINE-DEXTROAMPHETAMINE 30 MG PO TABS: 30.0000 mg | ORAL_TABLET | Freq: Two times a day (BID) | ORAL | 0 refills | Status: DC

## 2021-12-03 NOTE — Telephone Encounter (Signed)
Spoke with pt to give message. Please see My Chart note.  ?

## 2021-12-03 NOTE — Telephone Encounter (Signed)
Gave pt message below. He states that he was doing great on 20 mg XR, but they do not have it in stock. He appreciates the refill today.  ?

## 2021-12-08 ENCOUNTER — Telehealth: Payer: Self-pay | Admitting: Family Medicine

## 2021-12-08 NOTE — Telephone Encounter (Signed)
Pt refused ED and wanted a referral to ortho. Pt was scheduled with Lynne Leader on 12/09/21. ? ?Patient ?Name: ?George Campbell ?LLS ?Gender: Male ?DOB: [birth year] ?Age: 51 Y 1 M 15 D ?Return ?Phone ?Number: ?[id number] ?(Primary) ?Address: ?City/ ?State/ ?Zip: ?[place] ? 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 Jonni Sanger before but new level ?of pain, couldn't sleep since 230 am. Assumes ?shoulder injury but no known cause. ?Translation No ?Nurse Assessment ?Nurse: Terence Lux, RN, Christine Date/Time (Eastern Time): 12/08/2021 7:26:19 AM ?Confirm and document reason for call. If ?symptomatic, describe symptoms. ?---Caller has new increase in shoulder pain, which he ?has mentioned to Dr. Jonni Sanger before but new level of ?pain, couldn't sleep since 230 am. Assumes shoulder ?injury but no known cause. ?Does the patient have any new or worsening ?symptoms? ---Yes ?Will a triage be completed? ---Yes ?Related visit to physician within the last 2 weeks? ---No ?Does the PT have any chronic conditions? (i.e. ?diabetes, asthma, this includes High risk factors for ?pregnancy, etc.) ?---Yes ?List chronic conditions. ---htn. adhd, right shoulder ongoing pain ?Is this a behavioral health or substance abuse call? ---No ?Guidelines ?Guideline Title Affirmed Question Affirmed Notes Nurse Date/Time (Eastern ?Time) ?Shoulder Pain [1] SEVERE ?pain AND [2] not ?improved 2 hours ?after pain medicine ?Terence Lux, RN, ?Christine ?12/08/2021 7:27:43 ?AM ?Disp. Time (Eastern ?Time)  Disposition Final User ?12/08/2021 7:30:49 AM Go to ED Now (or PCP triage) Yes Terence Lux, RN, Christine ?Caller Disagree/Comply Disagree ?Caller Understands Yes ?PreDisposition Call Doctor ?Care Advice Given Per Guideline ?GO TO ED NOW (OR PCP TRIAGE): * IF NO PCP (PRIMARY CARE PROVIDER) SECOND-LEVEL TRIAGE: You need to ?be seen within the next hour. Go to the [facility] at _[address] [place] as soon as you can. CARE ADVICE given per ?Shoulder Pain (Adult) guideline ?Comments ?User: [name], RN Date/Time [name] Time): 12/08/2021 7:32:55 AM ?Pt looking to make appt this AM regarding Severe ongoing shoulder pain & wishes for an ortho referral ?Referrals ?GO TO FACILITY REFUSED ?

## 2021-12-08 NOTE — Progress Notes (Signed)
? ? ?Subjective:   ? ?CC: R shoulder pain ? ?I, Wendy Poet, LAT, ATC, am serving as scribe for Dr. Lynne Leader. ? ?HPI: Pt is a 51 y/o male presenting w/ c/o R shoulder pain x approximately one year that has worsened over the past weekend w/ no known MOI.  He locates his pain to deep inside his R shoulder.  Of note, he has lost approximately 100 lbs intentionally.  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 ? ?Radiating pain: yes into the R upper arm ?Neck pain: no ?Shoulder mechanical symptoms: yes ?Aggravating factors: R shoulder AROM of any type; laying down ?Treatments tried: Tyelnol; IBU 849m; diclofenac sodium; IcyHot; OTC pain patches ? ? ?Pertinent review of Systems: No fevers or chills ? ?Relevant historical information: Hypertension and diabetes.  ADHD.  History of alcohol abuse currently in remission. ? ? ?Objective:   ? ?Vitals:  ? 12/09/21 0826  ?BP: 110/74  ?Pulse: 82  ?SpO2: 98%  ? ?General: Well Developed, well nourished, and in no acute distress.  ? ?MSK: C-spine: Normal-appearing ?Normal cervical motion. ?Nontender midline. ? ?Right shoulder: Normal. ?Mildly tender palpation anterior superior shoulder. ?Decreased range of motion.  Abduction 90 degrees.  External rotation full functional internal rotation lower portion of lumbar spine. ?Strength abduction 3+/5 with pain.  External rotation 4/5 with pain.  Internal rotation 5/5. ?Positive Hawkins and Neer's test.  Positive empty can test.  Mildly positive Yergason's and speeds test. ?Pulses capillary refill and sensation are intact distally. ? ?Lab and Radiology Results ? ?Procedure: Real-time Ultrasound Guided Injection of right shoulder subacromial bursa ?Device: Philips Affiniti 50G ?Images permanently stored and available for review in PACS ?Ultrasound evaluation prior to injection reveals moderate subacromial bursitis.  Rotator cuff tendons appear  to be intact without large visible tear.  Mild hypoechoic fluid surrounds the biceps tendon in the bicipital groove. ?Verbal informed consent obtained.  Discussed risks and benefits of procedure. Warned about infection, bleeding, hyperglycemia damage to structures among others. ?Patient expresses understanding and agreement ?Time-out conducted.   ?Noted no overlying erythema, induration, or other signs of local infection.   ?Skin prepped in a sterile fashion.   ?Local anesthesia: Topical Ethyl chloride.   ?With sterile technique and under real time ultrasound guidance: 40 mg of Kenalog and 2 mL of Marcaine injected into subacromial bursa. Fluid seen entering the bursa.   ?Completed without difficulty   ?Pain moderately to significantly resolved suggesting accurate placement of the medication.   ?Advised to call if fevers/chills, erythema, induration, drainage, or persistent bleeding.   ?Images permanently stored and available for review in the ultrasound unit.  ?Impression: Technically successful ultrasound guided injection. ? ? ?X-ray images right shoulder obtained today personally and independently interpreted ?No acute fractures are present.  No severe degenerative changes are present. ?Think calcification/chondrocalcinosis presents just superior to the glenohumeral joint on the AP view. ?Await formal radiology review ? ? ? ? ?Impression and Recommendations:   ? ?Assessment and Plan: ?51y.o. male with right shoulder pain thought to be due to subacromial bursitis primarily. ?Plan for subacromial injection today as he has a fair amount of pain that is interfering with quality of life and work.  We will also refer to physical therapy.  Plan to recheck in about 6 weeks.  Return sooner if needed.  If not improved would consider MRI.  Work note provided  recommending light duty at work limiting lifting with his right arm.  ? ?PDMP not reviewed this encounter. ?Orders Placed This Encounter  ?Procedures  ? [place] LIMITED  JOINT SPACE STRUCTURES UP BILAT(NO LINKED CHARGES)  ?  Order Specific Question:   Reason for Exam (SYMPTOM  OR DIAGNOSIS REQUIRED)  ?  Answer:   shoulder pain  ?  Order Specific Question:   Preferred imaging location?  ?  Answer:   [facility]  ? DG Shoulder Right  ?  Standing Status:   Future  ?  Number of Occurrences:   1  ?  Standing Expiration Date:   01/08/2022  ?  Order Specific Question:   Reason for Exam (SYMPTOM  OR DIAGNOSIS REQUIRED)  ?  Answer:   R shoulder pain  ?  Order Specific Question:   Preferred imaging location?  ?  Answer:   [facility]  ? Ambulatory referral to Physical Therapy  ?  Referral Priority:   Routine  ?  Referral Type:   Physical Medicine  ?  Referral Reason:   Specialty Services Required  ?  Requested Specialty:   Physical Therapy  ?  Number of Visits Requested:   1  ? ?No orders of the defined types were placed in this encounter. ? ? ?Discussed warning signs or symptoms. Please see discharge instructions. Patient expresses understanding. ? ? ?The above documentation has been reviewed and is accurate and complete Lynne Leader, M.D. ? ?

## 2021-12-08 NOTE — Telephone Encounter (Signed)
Unable to contact patient, voice mail is full  ? ?Patient need OV for referral  ?

## 2021-12-09 ENCOUNTER — Ambulatory Visit: Payer: Self-pay

## 2021-12-09 ENCOUNTER — Encounter: Payer: Self-pay | Admitting: Family Medicine

## 2021-12-09 ENCOUNTER — Ambulatory Visit: Payer: BC Managed Care – PPO | Admitting: Family Medicine

## 2021-12-09 ENCOUNTER — Ambulatory Visit (INDEPENDENT_AMBULATORY_CARE_PROVIDER_SITE_OTHER): Payer: BC Managed Care – PPO

## 2021-12-09 VITALS — BP 110/74 | HR 82 | Ht 70.0 in | Wt 192.8 lb

## 2021-12-09 DIAGNOSIS — G8929 Other chronic pain: Secondary | ICD-10-CM | POA: Diagnosis not present

## 2021-12-09 DIAGNOSIS — M25511 Pain in right shoulder: Secondary | ICD-10-CM

## 2021-12-09 NOTE — Patient Instructions (Addendum)
Good to meet you today. ? ?You had a R shoulder injection.  Call or go to the ER if you develop a large red swollen joint with extreme pain or oozing puss.  ? ?I've referred you to Physical Therapy.  Their office will call you to schedule but please let us know if you don't hear from them in one week regarding scheduling. ? ?Please get an Xray today before you leave. ? ?Follow-up: 6 weeks ?

## 2021-12-09 NOTE — Telephone Encounter (Signed)
Please schedule appointment with the pt. ? ?Thanks!  ?

## 2021-12-09 NOTE — Telephone Encounter (Signed)
Please Disregard. Pt does not need appointment.   ?

## 2021-12-09 NOTE — Telephone Encounter (Signed)
When pt called in, before we received triage notes, [facility] had [facility]. Does he still need an appt with our office? Please advise ?

## 2021-12-10 NOTE — Progress Notes (Signed)
Right shoulder x-ray shows mild arthritis of the small joint at the top of the shoulder.  Otherwise the x-ray looks okay.

## 2021-12-16 ENCOUNTER — Ambulatory Visit: Payer: BC Managed Care – PPO | Admitting: Family Medicine

## 2021-12-16 ENCOUNTER — Encounter: Payer: Self-pay | Admitting: Physical Therapy

## 2021-12-16 ENCOUNTER — Ambulatory Visit: Payer: BC Managed Care – PPO | Attending: Family Medicine | Admitting: Physical Therapy

## 2021-12-16 DIAGNOSIS — G8929 Other chronic pain: Secondary | ICD-10-CM | POA: Insufficient documentation

## 2021-12-16 DIAGNOSIS — M25511 Pain in right shoulder: Secondary | ICD-10-CM | POA: Insufficient documentation

## 2021-12-16 DIAGNOSIS — R293 Abnormal posture: Secondary | ICD-10-CM | POA: Diagnosis not present

## 2021-12-16 DIAGNOSIS — M6281 Muscle weakness (generalized): Secondary | ICD-10-CM | POA: Diagnosis not present

## 2021-12-16 DIAGNOSIS — M25611 Stiffness of right shoulder, not elsewhere classified: Secondary | ICD-10-CM | POA: Insufficient documentation

## 2021-12-16 NOTE — Patient Instructions (Signed)
Access Code: [email or web address] ?URL: [email or web address] ?Date: 12/16/2021 ?Prepared by: Glenetta Hew ? ?Exercises ?- Seated Cervical Retraction  - 3 x daily - 7 x weekly - 10 reps - 5 sec  hold ?- Seated Scapular Retraction  - 3 x daily - 7 x weekly - 10 reps - 5 sec hold ?- Seated Shoulder Rolls  - 3 x daily - 7 x weekly - 10 reps - 5 sec  hold ?- Doorway Pec Stretch at 90 Degrees Abduction  - 1 x daily - 7 x weekly - 1 sets - 3 reps - 30 sec  hold ?- Shoulder extension with resistance - Neutral  - 1 x daily - 7 x weekly - 2 sets - 10 reps ?- Standing Shoulder Row with Anchored Resistance  - 1 x daily - 7 x weekly - 2 sets - 10 reps ?

## 2021-12-16 NOTE — Therapy (Signed)
[place] [facility] ?[place] ?[place], [place], [address] ?Phone: [phone]   Fax:  [phone] ? ?Physical Therapy Evaluation ? ?Patient Details  ?Name: George Campbell ?MRN: [id number] ?Date of Birth: [birth year] ?Referring Provider (PT): Gregor Hams MD ? ? ?Encounter Date: 12/16/2021 ? ? PT End of Session - 12/16/21 1314   ? ? Visit Number 1   ? Number of Visits 6   ? Date for PT Re-Evaluation 01/27/22   ? Authorization Type BCBS   ? PT Start Time 1315   ? PT Stop Time 9678   ? PT Time Calculation (min) 44 min   ? Activity Tolerance Patient tolerated treatment well   ? Behavior During Therapy [facility] for tasks assessed/performed   ? ?  ?  ? ?  ? ? ?Past Medical History:  ?Diagnosis Date  ? Anxiety   ? Depression   ? Hypertension   ? Inguinal hernia   ? s/p inguinal mesh repair 2021  ? Substance abuse ([place]) 02 2019  ? ? ?Past Surgical History:  ?Procedure Laterality Date  ? HERNIA REPAIR    ? KNEE ARTHRODESIS Right 2002  ? MOUTH SURGERY    ? Dentures 09/15/2017  ? XI ROBOTIC ASSISTED INGUINAL HERNIA REPAIR WITH MESH Bilateral 09/25/2019  ? Procedure: XI ROBOTIC ASSISTED BILATERAL INGUINAL HERNIA REPAIR WITH MESH;  Surgeon: Clovis Riley, MD;  Location: [place];  Service: General;  Laterality: Bilateral;  ? ? ?There were no vitals filed for this visit. ? ? ? Subjective Assessment - 12/16/21 1314   ? ? Subjective Pt. reported R shoulder pain started a few weeks ago, had history of R shoulder pain then suddenly wasn't able to lift it, has improved a lot but still very sore.  "Every once and a while it goes out but this was worse".  Dr Georgina Snell gave his a subacromial shot which helped significantly.  Does a lot of overhead work at work.   ? Pertinent History --   ? Limitations Lifting   ? Diagnostic tests R shoulder X-ray 12/09/21 FINDINGS:  Minimal acromioclavicular peripheral osteophytosis. The glenohumeral  joint space is maintained. No acute  fracture or dislocation. The  visualized portion of the right lung is unremarkable.     IMPRESSION:  Minimal acromioclavicular osteoarthritis.   ? Patient Stated Goals not to have the pain keep happening in shoulder   ? Currently in Pain? Yes   ? Pain Score 0-No pain   5/10 when using  ? Pain Location Shoulder   ? Pain Orientation Right   ? Pain Descriptors / Indicators Aching;Sore   ? Pain Type Acute pain;Chronic pain   ? Pain Radiating Towards some pain in triceps now, but mostly localized to anterior shoulder   ? Pain Onset 1 to 4 weeks ago   ? Pain Frequency Intermittent   ? Aggravating Factors  raising arms overhead   ? Pain Relieving Factors tylenol   ? Effect of Pain on Daily Activities on restrictions at work currently.   ? ?  ?  ? ?  ? ? ? ? ? [facility] PT Assessment - 12/16/21 0001   ? ?  ? Assessment  ? Medical Diagnosis M25.511,G89.29 (ICD-10-CM) - Chronic right shoulder pain   ? Referring Provider (PT) Gregor Hams MD   ? Next MD Visit 01/20/22   ?  ? Precautions  ? Precautions None   ?  ? Restrictions  ?  Weight Bearing Restrictions No   ?  ? Balance Screen  ? Has the patient fallen in the past 6 months No   ? Has the patient had a decrease in activity level because of a fear of falling?  No   ? Is the patient reluctant to leave their home because of a fear of falling?  No   ?  ? Home Environment  ? Living Environment Private residence   ? Living Arrangements Spouse/significant other;Children   ? Type of Home House   ? Home Layout Two level   ? Alternate Level Stairs-Number of Steps 12   ? Alternate Level Stairs-Rails Right;Left;Can reach both   ?  ? Prior Function  ? Level of Independence Independent   ? Vocation Full time employment   ? 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 Leisure playing basketball with son   ?  ? Cognition  ? Overall Cognitive Status Within Functional Limits for tasks assessed   ?  ? Observation/Other Assessments  ?  Observations enters independent in no apparent distress   ? Focus on Therapeutic Outcomes (FOTO)  shoulder 50%; predicted outcome 72% after 11 visits   ?  ? Sensation  ? Light Touch Appears Intact   ?  ? Coordination  ? Gross Motor Movements are Fluid and Coordinated Yes   ?  ? Posture/Postural Control  ? Posture/Postural Control Postural limitations   ? Postural Limitations Rounded Shoulders;Forward head;Increased thoracic kyphosis   ?  ? ROM / Strength  ? AROM / PROM / Strength AROM;Strength   ?  ? AROM  ? Overall AROM  Deficits   ? Overall AROM Comments tested in sitting   ? AROM Assessment Site Shoulder   ? Right/Left Shoulder Right;Left   ? Right Shoulder Flexion 140 Degrees   ? Right Shoulder ABduction 115 Degrees   increased discomfort/tightness  ? Right Shoulder Internal Rotation --   functional to T4, symmetric left  ? Right Shoulder External Rotation --   functional to T12, less than L  ? Left Shoulder Flexion 155 Degrees   ? Left Shoulder ABduction 135 Degrees   ? Left Shoulder Internal Rotation --   functional to T4  ? Left Shoulder External Rotation --   functional to T10  ?  ? Strength  ? Overall Strength Deficits   ? Overall Strength Comments tested in sitting   ? Strength Assessment Site Shoulder;Elbow;Wrist;Hand   ? Right/Left Shoulder Right;Left   ? Right Shoulder Flexion 4+/5   ? Right Shoulder ABduction 4+/5   ? Right Shoulder Internal Rotation 4+/5   ? Right Shoulder External Rotation 5/5   ? Left Shoulder Flexion 5/5   ? Left Shoulder ABduction 5/5   ? Left Shoulder Internal Rotation 5/5   ? Left Shoulder External Rotation 5/5   ? Right/Left Elbow Right;Left   ? Right Elbow Flexion 4+/5   ? Right Elbow Extension 4/5   ? Left Elbow Flexion 5/5   ? Left Elbow Extension 5/5   ? Right/Left Wrist Right;Left   ? Right Wrist Flexion 5/5   ? Right Wrist Extension 5/5   ? Left Wrist Flexion 5/5   ? Left Wrist Extension 5/5   ? Right/Left hand Right;Left   ? Right Hand Gross Grasp Functional   ? Left  Hand Gross Grasp Functional   ?  ? Palpation  ? Palpation comment no tenderness in R shoulder  musculature, increased tightness in R UT compared to L.   ? ?  ?  ? ?  ? ? ? ? ? ? ? ? ? ? ? ? ? ?Objective measurements completed on examination: See above findings.  ? ? ? ? ? [place] Adult PT Treatment/Exercise - 12/16/21 0001   ? ?  ? Exercises  ? Exercises Shoulder   ?  ? Shoulder Exercises: Seated  ? Other Seated Exercises chin tucks x 10, scap squeezes x 10, shoulder rolls retro x 10 for posture   ?  ? Shoulder Exercises: Standing  ? External Rotation Both;Limitations   ? External Rotation Limitations increased R shoulder pain so d/c'd   ? Extension Strengthening;Both;10 reps;Theraband   ? Theraband Level (Shoulder Extension) Level 2 (Red)   ? Extension Limitations cues for technique   ? Row Strengthening;Both;10 reps;Theraband   ? Theraband Level (Shoulder Row) Level 2 (Red)   ? Row Limitations cues for technique   ?  ? Shoulder Exercises: ROM/Strengthening  ? Other ROM/Strengthening Exercises pec stretch in doorway   ? ?  ?  ? ?  ? ? ? ? ? ? ? ? ? ? PT Education - 12/16/21 1400   ? ? Education Details Educated on findings, plan of care, and initial HEP for postural strengthening.  Access Code: [email or web address]  Issued RTB   ? Person(s) Educated Patient   ? Methods Explanation;Demonstration;Verbal cues;Handout   ? Comprehension Verbalized understanding;Returned demonstration   ? ?  ?  ? ?  ? ? ? PT Short Term Goals - 12/16/21 1607   ? ?  ? PT SHORT TERM GOAL #1  ? Title Ind with initial HEP   ? Time 2   ? Period Weeks   ? Status New   ? Target Date 12/30/21   ? ?  ?  ? ?  ? ? ? ? PT Long Term Goals - 12/16/21 1607   ? ?  ? PT LONG TERM GOAL #1  ? Title Ind with progressed HEP to improve functional outcomes   ? Time 6   ? Period Weeks   ? Status New   ? Target Date 01/27/22   ?  ? PT LONG TERM GOAL #2  ? Title Pt. will demonstrate 5/5 R shoulder strength without pain.   ? Baseline see flowsheet   ? Time 6   ? Period Weeks   ?  Status New   ? Target Date 01/27/22   ?  ? PT LONG TERM GOAL #3  ? Title Pt. will demonstrate improved posture without cuing   ? Baseline foward head, rounded shoulders, increased kyphosis but can correct with cues

## 2021-12-23 ENCOUNTER — Ambulatory Visit: Payer: BC Managed Care – PPO | Admitting: Physical Therapy

## 2021-12-23 ENCOUNTER — Encounter: Payer: Self-pay | Admitting: Physical Therapy

## 2021-12-23 DIAGNOSIS — M25611 Stiffness of right shoulder, not elsewhere classified: Secondary | ICD-10-CM | POA: Diagnosis not present

## 2021-12-23 DIAGNOSIS — M6281 Muscle weakness (generalized): Secondary | ICD-10-CM | POA: Diagnosis not present

## 2021-12-23 DIAGNOSIS — G8929 Other chronic pain: Secondary | ICD-10-CM

## 2021-12-23 DIAGNOSIS — R293 Abnormal posture: Secondary | ICD-10-CM

## 2021-12-23 DIAGNOSIS — M25511 Pain in right shoulder: Secondary | ICD-10-CM | POA: Diagnosis not present

## 2021-12-23 NOTE — Therapy (Signed)
[place] ?[facility] ?[address] ?[place], [place], [address] ?Phone: [phone]   Fax:  [phone] ? ?Physical Therapy Treatment ? ?Patient Details  ?Name: George Campbell ?MRN: [id number] ?Date of Birth: [birth year] ?Referring Provider (PT): Gregor Hams MD ? ? ?Encounter Date: 12/23/2021 ? ? PT End of Session - 12/23/21 1454   ? ? Visit Number 2   ? Number of Visits 6   ? Date for PT Re-Evaluation 01/27/22   ? Authorization Type BCBS   ? PT Start Time 1450   ? PT Stop Time 1884   ? PT Time Calculation (min) 42 min   ? Activity Tolerance Patient tolerated treatment well   ? Behavior During Therapy [facility] for tasks assessed/performed   ? ?  ?  ? ?  ? ? ?Past Medical History:  ?Diagnosis Date  ? Anxiety   ? Depression   ? Hypertension   ? Inguinal hernia   ? s/p inguinal mesh repair 2021  ? Substance abuse ([place]) 02 2019  ? ? ?Past Surgical History:  ?Procedure Laterality Date  ? HERNIA REPAIR    ? KNEE ARTHRODESIS Right 2002  ? MOUTH SURGERY    ? Dentures 09/15/2017  ? XI ROBOTIC ASSISTED INGUINAL HERNIA REPAIR WITH MESH Bilateral 09/25/2019  ? Procedure: XI ROBOTIC ASSISTED BILATERAL INGUINAL HERNIA REPAIR WITH MESH;  Surgeon: Clovis Riley, MD;  Location: [facility];  Service: General;  Laterality: Bilateral;  ? ? ?There were no vitals filed for this visit. ? ? Subjective Assessment - 12/23/21 1453   ? ? Subjective Pt. reports shoulder is a lot better, "just soreness like I overdid it."  Has been focusing on how he's picking up stuff at work.  Does exercises intermittently.   ? Limitations Lifting   ? Diagnostic tests R shoulder X-ray 12/09/21 FINDINGS:  Minimal acromioclavicular peripheral osteophytosis. The glenohumeral  joint space is maintained. No acute fracture or dislocation. The  visualized portion of the right lung is unremarkable.     IMPRESSION:  Minimal acromioclavicular osteoarthritis.   ? Patient Stated Goals not to have the pain  keep happening in shoulder   ? Currently in Pain? No/denies   ? Pain Onset 1 to 4 weeks ago   ? ?  ?  ? ?  ? ? ? ? ? ? ? ? ? ? ? ? ? ? ? ? ? ? ? ? [place] Adult PT Treatment/Exercise - 12/23/21 0001   ? ?  ? Shoulder Exercises: Prone  ? Other Prone Exercises thread the needle x 10 bil, cat/cow x 10 in quadruped   ?  ? Shoulder Exercises: ROM/Strengthening  ? Nustep L5 x 6 min   ? Wall Pushups 10 reps   ? Ball on Wall x20 CW/CCW flexion and abduction R side   ? Other ROM/Strengthening Exercises wall angels x 10   ? Other ROM/Strengthening Exercises standing open books x 5 bil   ?  ? Manual Therapy  ? Manual Therapy Soft tissue mobilization   ? Manual therapy comments to decrease muscle spasm and pain R shoulder   ? Soft tissue mobilization IASTM with s/s tools to R pec and biceps   ? ?  ?  ? ?  ? ? ? ? ? ? ? ? ? ? PT Education - 12/23/21 1533   ? ? Education Details HEP update   ? Person(s) Educated Patient   ? Methods Explanation;Demonstration;Verbal cues;Handout   ?  Comprehension Verbalized understanding;Returned demonstration   ? ?  ?  ? ?  ? ? ? PT Short Term Goals - 12/16/21 1607   ? ?  ? PT SHORT TERM GOAL #1  ? Title Ind with initial HEP   ? Time 2   ? Period Weeks   ? Status New   ? Target Date 12/30/21   ? ?  ?  ? ?  ? ? ? ? PT Long Term Goals - 12/16/21 1607   ? ?  ? PT LONG TERM GOAL #1  ? Title Ind with progressed HEP to improve functional outcomes   ? Time 6   ? Period Weeks   ? Status New   ? Target Date 01/27/22   ?  ? PT LONG TERM GOAL #2  ? Title Pt. will demonstrate 5/5 R shoulder strength without pain.   ? Baseline see flowsheet   ? Time 6   ? Period Weeks   ? Status New   ? Target Date 01/27/22   ?  ? PT LONG TERM GOAL #3  ? Title Pt. will demonstrate improved posture without cuing   ? Baseline foward head, rounded shoulders, increased kyphosis but can correct with cues   ? Time 6   ? Period Weeks   ? Status New   ? Target Date 01/27/22   ?  ? PT LONG TERM GOAL #4  ? Title Pt. will demonstrate R  shoulder ROM WNL without pain to participate in playing basketball with son.   ? Baseline see flowsheet   ? Time 6   ? Period Weeks   ? Status New   ? Target Date 01/27/22   ?  ? PT LONG TERM GOAL #5  ? Title Pt. will be able to lift 10lbs overhead without pain for work duties.   ? Baseline unable   ? Time 6   ? Period Weeks   ? Status New   ? Target Date 01/27/22   ?  ? Additional Long Term Goals  ? Additional Long Term Goals Yes   ?  ? PT LONG TERM GOAL #6  ? Title Pt. will score at least 72% on FOTO to demonstrate improved functional ability.   ? Baseline 50%   ? Time 6   ? Period Weeks   ? Status New   ? Target Date 01/27/22   ? ?  ?  ? ?  ? ? ? ? ? ? ? ? Plan - 12/23/21 1540   ? ? Clinical Impression Statement Pt. is making good progress but still demonstrates foward head/rounded shoulder posture.  Today focused on thoracic and scapular mobility and postural reeducation.  Reported tightness but no pain in R shoulder.  Reported decreased tightness following manual therapy.  Noted tightness and trigger points in R pec today, many consider dry needling next session.   ? Examination-Activity Limitations Lift;Reach Overhead;Carry   ? Examination-Participation Restrictions Community Activity;Occupation   ? Stability/Clinical Decision Making Stable/Uncomplicated   ? Rehab Potential Good   ? PT Frequency 1x / week   due to copay and work  ? PT Duration 6 weeks   ? PT Treatment/Interventions ADLs/Self Care Home Management;Cryotherapy;Electrical Stimulation;Iontophoresis 76m/ml Dexamethasone;Ultrasound;Moist Heat;Therapeutic activities;Therapeutic exercise;Neuromuscular re-education;Patient/family education;Manual techniques;Passive range of motion;Dry needling;Spinal Manipulations;Joint Manipulations;Taping   ? PT Next Visit Plan review and progress HEP, manual therapy/modalities PRN   ? PT Home Exercise Plan Access Code: [email or web address]  ? Consulted and Agree with Plan of Care Patient   ? ?  ?  ? ?  ? ? ?  Patient will benefit  from skilled therapeutic intervention in order to improve the following deficits and impairments:  Decreased activity tolerance, Decreased endurance, Decreased range of motion, Decreased strength, Increased fascial restricitons, Impaired UE functional use, Improper body mechanics, Pain, Postural dysfunction, Impaired flexibility, Increased muscle spasms ? ?Visit Diagnosis: ?Chronic right shoulder pain ? ?Stiffness of right shoulder, not elsewhere classified ? ?Abnormal posture ? ?Muscle weakness (generalized) ? ? ? ? ?Problem List ?Patient Active Problem List  ? Diagnosis Date Noted  ? Type 2 diabetes mellitus without complication, without long-term current use of insulin ([place]) 08/05/2021  ? Alcoholism in recovery ([place]) 11/24/2017  ? Fatty liver, alcoholic 40/35/2481  ? Hypertension associated with type 2 diabetes mellitus ([place]) 03/25/2015  ? Class 1 obesity due to excess calories with serious comorbidity and body mass index (BMI) of 34.0 to 34.9 in adult 08/02/2013  ? Attention deficit disorder (ADD) without hyperactivity 04/17/2013  ? ? ?Rennie Natter, PT, DPT  ?12/23/2021, 4:00 PM ? ?[facility] ?[facility] ?[address] ?[place], [place], [address] ?Phone: [phone]   Fax:  [phone] ? ?Name: George Campbell ?MRN: [id number] ?Date of Birth: [birth year] ? ? ? ?

## 2021-12-23 NOTE — Patient Instructions (Signed)
Access Code: [email or web address] ?URL: [email or web address] ?Date: 12/23/2021 ?Prepared by: Glenetta Hew ? ?Exercises Added  ?- Cat Cow  - 1 x daily - 7 x weekly - 10-15 reps ?- Quadruped Full Range Thoracic Rotation with Reach  - 1 x daily - 7 x weekly - 2 sets - 10 reps ?- Wall Push Up  - 1 x daily - 7 x weekly - 2-3 sets - 10 reps ?- Wall Angels  - 1 x daily - 7 x weekly - 2-3 sets - 10 reps ?- Standing Wall [employer] with [employer]  - 1 x daily - 7 x weekly - 2 sets - 20 reps ?- Standing Wall [employer] in [place] with Mini Swiss Ball  - 1 x daily - 7 x weekly - 2 sets - 20 reps ?- Isometric Cervical Extension at Wall with Ball  - 1 x daily - 7 x weekly - 10 reps - 5 sec  hold ?- Standing Thoracic Open Book at [place] 1 x daily - 7 x weekly - 2 sets - 10 reps ?

## 2021-12-25 ENCOUNTER — Encounter: Payer: BC Managed Care – PPO | Admitting: Physical Therapy

## 2021-12-28 ENCOUNTER — Encounter: Payer: Self-pay | Admitting: Family Medicine

## 2021-12-28 MED ORDER — AMPHETAMINE-DEXTROAMPHETAMINE 30 MG PO TABS: 30.0000 mg | ORAL_TABLET | Freq: Two times a day (BID) | ORAL | 0 refills | Status: DC

## 2022-01-04 ENCOUNTER — Encounter: Payer: BC Managed Care – PPO | Admitting: Physical Therapy

## 2022-01-06 ENCOUNTER — Ambulatory Visit: Payer: BC Managed Care – PPO

## 2022-01-13 ENCOUNTER — Ambulatory Visit: Payer: BC Managed Care – PPO | Attending: Family Medicine

## 2022-01-13 DIAGNOSIS — M25611 Stiffness of right shoulder, not elsewhere classified: Secondary | ICD-10-CM | POA: Insufficient documentation

## 2022-01-13 DIAGNOSIS — G8929 Other chronic pain: Secondary | ICD-10-CM | POA: Insufficient documentation

## 2022-01-13 DIAGNOSIS — M25511 Pain in right shoulder: Secondary | ICD-10-CM | POA: Insufficient documentation

## 2022-01-13 DIAGNOSIS — M6281 Muscle weakness (generalized): Secondary | ICD-10-CM | POA: Insufficient documentation

## 2022-01-13 DIAGNOSIS — R293 Abnormal posture: Secondary | ICD-10-CM | POA: Insufficient documentation

## 2022-01-19 NOTE — Progress Notes (Unsigned)
I, George Campbell, LAT, ATC, am serving as scribe for George Campbell.  George Campbell is a 51 y.o. male who presents to [place] at [facility] today for f/u of chronic R shoulder pain thought to be due to subacromial bursitis primarily.  He was last seen by George Campbell on 12/09/21 and had a R subacromial steroid injection.  He was also referred to PT of which he's completed 2 visits.  Today, pt reports R shoulder is feeling much better, 70-80% improvement. Pt has been working on [employer] and stretches.  He would like a refill of his oral diclofenac.  He has benefit with this medication in the past for pain.  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  No history of CKD.  Exam:  BP 120/82   Pulse 80   Ht 5' 10"  (1.778 m)   Wt 186 lb 6.4 oz (84.6 kg)   SpO2 98%   BMI 26.75 kg/m  General: Well Developed, well nourished, and in no acute distress.   MSK: Right shoulder: Normal. Nontender. Normal motion mild pain with abduction. Intact strength. Mildly positive Hawkins and Neer's test.    Lab and Radiology Results EXAM: RIGHT SHOULDER - 2+ VIEW   COMPARISON:  None   FINDINGS: Minimal acromioclavicular peripheral osteophytosis. The glenohumeral joint space is maintained. No acute fracture or dislocation. The visualized portion of the right lung is unremarkable.   IMPRESSION: Minimal acromioclavicular osteoarthritis.     Electronically Signed   By: George Campbell M.D.   On: 12/09/2021 15:38    I, George Campbell, personally (independently) visualized and performed the interpretation of the images attached in this note.     Chemistry      Component Value Date/Time   NA 139 11/03/2021 0931   K 3.8 11/03/2021 0931   CL 102 11/03/2021 0931   CO2 31 11/03/2021 0931   BUN 10 11/03/2021 0931   CREATININE 0.75 11/03/2021  0931      Component Value Date/Time   CALCIUM 9.7 11/03/2021 0931   ALKPHOS 58 11/03/2021 0931   AST 19 11/03/2021 0931   ALT 18 11/03/2021 0931   BILITOT 0.6 11/03/2021 0931    ,      Assessment and Plan: 51 y.o. male with right shoulder pain.  Significant improvement with limited physical therapy and subacromial injection.  Plan to finish out physical therapy course in the near future and continue home exercise program. He requested a refill of oral diclofenac.  I think this is okay if he takes it intermittently and somewhat rarely.  However higher dose NSAIDs on a continuous basis are generally not a good idea especially in the setting of diabetes and hypertension.  He understands and will use it intermittently only.  Recheck back as needed.  Check back precautions reviewed.  Total encounter time 20 minutes including face-to-face time with the patient and, reviewing past medical record, and charting on the date of service.   Discussed medication safety check back precautions and indications. PDMP not reviewed this encounter. No orders of the defined types were placed in this encounter.  Meds ordered this encounter  Medications   diclofenac (VOLTAREN) 75 MG EC tablet    Sig: TAKE 1 TABLET(75 MG) BY MOUTH TWICE DAILY prn pain    Dispense:  30 tablet    Refill:  0  Discussed warning signs or symptoms. Please see discharge instructions. Patient expresses understanding.   The above documentation has been reviewed and is accurate and complete George Campbell, M.D.

## 2022-01-20 ENCOUNTER — Ambulatory Visit: Payer: BC Managed Care – PPO | Admitting: Family Medicine

## 2022-01-20 VITALS — BP 120/82 | HR 80 | Ht 70.0 in | Wt 186.4 lb

## 2022-01-20 DIAGNOSIS — E1159 Type 2 diabetes mellitus with other circulatory complications: Secondary | ICD-10-CM | POA: Diagnosis not present

## 2022-01-20 DIAGNOSIS — M25511 Pain in right shoulder: Secondary | ICD-10-CM

## 2022-01-20 DIAGNOSIS — I152 Hypertension secondary to endocrine disorders: Secondary | ICD-10-CM

## 2022-01-20 DIAGNOSIS — G8929 Other chronic pain: Secondary | ICD-10-CM

## 2022-01-20 MED ORDER — DICLOFENAC SODIUM 75 MG PO TBEC: DELAYED_RELEASE_TABLET | ORAL | 0 refills | Status: DC

## 2022-01-20 NOTE — Patient Instructions (Addendum)
Thank you for coming in today.   Glad your shoulder is improving.  Continue working on the exercises and stretches.  Check back with me as needed.

## 2022-01-27 ENCOUNTER — Ambulatory Visit: Payer: BC Managed Care – PPO | Admitting: Physical Therapy

## 2022-01-27 ENCOUNTER — Encounter: Payer: Self-pay | Admitting: Physical Therapy

## 2022-01-27 DIAGNOSIS — R293 Abnormal posture: Secondary | ICD-10-CM

## 2022-01-27 DIAGNOSIS — M25611 Stiffness of right shoulder, not elsewhere classified: Secondary | ICD-10-CM | POA: Diagnosis not present

## 2022-01-27 DIAGNOSIS — M6281 Muscle weakness (generalized): Secondary | ICD-10-CM | POA: Diagnosis not present

## 2022-01-27 DIAGNOSIS — M25511 Pain in right shoulder: Secondary | ICD-10-CM | POA: Diagnosis not present

## 2022-01-27 DIAGNOSIS — G8929 Other chronic pain: Secondary | ICD-10-CM | POA: Diagnosis not present

## 2022-01-27 NOTE — Patient Instructions (Signed)

## 2022-01-27 NOTE — Therapy (Addendum)
OUTPATIENT PHYSICAL THERAPY TREATMENT NOTE PHYSICAL THERAPY DISCHARGE SUMMARY  Visits from Start of Care: 3  Current functional level related to goals / functional outcomes: From 01/27/22 - no shoulder pain or pain with overhead lifting, 80% improvement.  FOTO 70%, 5/5 R shoulder strength.    Remaining deficits: Pain end range shoulder flexion   Education / Equipment: HEP  Plan: Patient agrees to discharge.  Patient is being discharged due to meeting the stated rehab goals.  Patient did not return after 01/27/22 visit.    Rennie Natter, PT, DPT 4:43 PM 03/18/2022    Patient Name: George Campbell MRN: [id number] DOB:1971-06-27, 51 y.o., male Today's Date: 01/27/2022  PCP: Leamon Arnt, MD  REFERRING PROVIDER: Gregor Hams, MD   PT End of Session - 01/27/22 1405     Visit Number 3    Number of Visits 6    Date for PT Re-Evaluation 01/27/22    Authorization Type BCBS    PT Start Time 1194    PT Stop Time 1740    PT Time Calculation (min) 38 min    Activity Tolerance Patient tolerated treatment well    Behavior During Therapy [facility] for tasks assessed/performed             Past Medical History:  Diagnosis Date   Anxiety    Depression    Hypertension    Inguinal hernia    s/p inguinal mesh repair 2021   Substance abuse ([place]) 02 2019   Past Surgical History:  Procedure Laterality Date   HERNIA REPAIR     KNEE ARTHRODESIS Right 2002   MOUTH SURGERY     Dentures 09/15/2017   XI ROBOTIC ASSISTED INGUINAL HERNIA REPAIR WITH MESH Bilateral 09/25/2019   Procedure: XI ROBOTIC ASSISTED BILATERAL INGUINAL HERNIA REPAIR WITH MESH;  Surgeon: Clovis Riley, MD;  Location: [place];  Service: General;  Laterality: Bilateral;   Patient Active Problem List   Diagnosis Date Noted   Type 2 diabetes mellitus without complication, without long-term current use of insulin ([place]) 08/05/2021   Alcoholism in recovery ([place]) 11/24/2017   Fatty liver, alcoholic  81/44/8185   Hypertension associated with type 2 diabetes mellitus ([place]) 03/25/2015   Class 1 obesity due to excess calories with serious comorbidity and body mass index (BMI) of 34.0 to 34.9 in adult 08/02/2013   Attention deficit disorder (ADD) without hyperactivity 04/17/2013    REFERRING DIAG: M25.511,G89.29 (ICD-10-CM) - Chronic right shoulder pain  THERAPY DIAG:  Chronic right shoulder pain  Stiffness of right shoulder, not elsewhere classified  Abnormal posture  Muscle weakness (generalized)  Rationale for Evaluation and Treatment Rehabilitation  PERTINENT HISTORY: R AC OA on imaging  PRECAUTIONS: none  SUBJECTIVE: Pt. Reports shoulder has not bothered him lifting or raising overhead.  He is able to shoot basketball again.  He did wake up with neck pain a couple of days ago.  Saw referring MD on 01/20/22 and reported 80% improvement overall.   PAIN:  Are you having pain? Yes: NPRS scale: 6/10 Pain location: neck, both sides Pain description: ache Aggravating factors: turning neck  Relieving factors: heat  OBJECTIVE: (objective measures completed at initial evaluation unless otherwise dated)   AROM 01/27/2022     Overall AROM  Deficits    12/16/2021 WNL     Overall AROM Comments tested in sitting       AROM Assessment Site Shoulder       Right/Left Shoulder Right;Left  Right Shoulder Flexion 140 Degrees  165     Right Shoulder ABduction 115 Degrees   increased discomfort/tightness 165 - pain at end range     Right Shoulder Internal Rotation --   functional to T4, symmetric left      Right Shoulder External Rotation --   functional to T12, less than L      Left Shoulder Flexion 155 Degrees       Left Shoulder ABduction 135 Degrees       Left Shoulder Internal Rotation --   functional to T4      Left Shoulder External Rotation --   functional to T10             Strength      Overall Strength Deficits  WNL     Overall Strength Comments tested in sitting  No pain      Strength Assessment Site Shoulder;Elbow;Wrist;Hand       Right/Left Shoulder Right;Left       Right Shoulder Flexion 4+/5  5/5     Right Shoulder ABduction 4+/5  5/5     Right Shoulder Internal Rotation 4+/5  5/5     Right Shoulder External Rotation 5/5  5/5     Left Shoulder Flexion 5/5       Left Shoulder ABduction 5/5       Left Shoulder Internal Rotation 5/5       Left Shoulder External Rotation 5/5       Right/Left Elbow Right;Left       Right Elbow Flexion 4+/5       Right Elbow Extension 4/5       Left Elbow Flexion 5/5       Left Elbow Extension 5/5       Right/Left Wrist Right;Left       Right Wrist Flexion 5/5       Right Wrist Extension 5/5       Left Wrist Flexion 5/5       Left Wrist Extension 5/5       Right/Left hand Right;Left       Right Hand Gross Grasp Functional       Left Hand Gross Grasp Functional              Palpation      Palpation comment no tenderness in R shoulder musculature, increased tightness in R UT compared to L.        TODAY'S TREATMENT:  01/27/2022 Therapeutic Exercise: to improve strength and mobility.  Demo, verbal and tactile cues throughout for technique. UBE x 6 min f/b Levator stretches Review of HEP  Physical Performance Test: Strength, ROM, FOTO  Manual Therapy: to decrease muscle spasm and pain and improve mobility STM to cervical paraspinals, TPR to bil UT, PA mobs cervical spine. Skilled palpation and monitoring with dry needling.   Trigger Point Dry-Needling  Treatment instructions: Expect mild to moderate muscle soreness. S/S of pneumothorax if dry needled over a lung field, and to seek immediate medical attention should they occur. Patient verbalized understanding of these instructions and education.  Patient Consent Given: Yes Education handout provided: Yes Muscles treated: bil UT, levator scapulae, cervical multifidi C3-6 Electrical stimulation performed: No Parameters: N/A Treatment response/outcome: Twitch  Response Elicited and Palpable Increase in Muscle Length    PATIENT EDUCATION: Education details: education on trigger point dry needling.  Person educated: Patient Education method: Explanation, Demonstration, and Handouts Education comprehension: verbalized understanding   HOME EXERCISE PROGRAM: [id number]  ASSESSMENT: Clinical Impression: Pt. Has made very good progress towards goals and has met or almost met all goals, demonstrating 5/5 R shoulder strength, improved posture, and full R shoulder ROM however still has pain at end range.  He reported increased neck/upper shoulder pain today, consented to dry needling after education, and reported almost no pain following interventions.  Extending current POC for additional 4 weeks to allow more visits if needed.     PT Short Term Goals - 01/27/22 1409       PT SHORT TERM GOAL #1   Title Ind with initial HEP    Time 2    Period Weeks    Status Achieved    Target Date 12/30/21              PT Long Term Goals - 01/27/22 1409       PT LONG TERM GOAL #1   Title Ind with progressed HEP to improve functional outcomes    Time 6    Period Weeks    Status On-going    Target Date 01/27/22      PT LONG TERM GOAL #2   Title Pt. will demonstrate 5/5 R shoulder strength without pain.    Baseline see flowsheet    Time 6    Period Weeks    Status Achieved   01/27/22- 5/5 bil shoulder strength   Target Date 01/27/22      PT LONG TERM GOAL #3   Title Pt. will demonstrate improved posture without cuing    Baseline foward head, rounded shoulders, increased kyphosis but can correct with cues    Time 6    Period Weeks    Status On-going   01/27/22- improved, more mindful, has others remind.   Target Date 01/27/22      PT LONG TERM GOAL #4   Title Pt. will demonstrate R shoulder ROM WNL without pain to participate in playing basketball with son.    Baseline see flowsheet    Time 6    Period Weeks    Status On-going   01/27/22-  pain at endrange R flexion 4-5/10.   Target Date 01/27/22      PT LONG TERM GOAL #5   Title Pt. will be able to lift 10lbs overhead without pain for work duties.    Baseline unable    Time 6    Period Weeks    Status On-going    Target Date 02/26/22      PT LONG TERM GOAL #6   Title Pt. will score at least 72% on FOTO to demonstrate improved functional ability.    Baseline 50%    Time 6    Period Weeks    Status On-going   01/27/22- 70%   Target Date 01/27/22             PLAN:   PT Frequency 1x / week     PT Duration For additional 4 weeks to 02/26/2022  PT Treatment/Interventions ADLs/Self Care Home Management;Cryotherapy;Electrical Stimulation;Iontophoresis 61m/ml Dexamethasone;Ultrasound;Moist Heat;Therapeutic activities;Therapeutic exercise;Neuromuscular re-education;Patient/family education;Manual techniques;Passive range of motion;Dry needling;Spinal Manipulations;Joint Manipulations;Taping   PT Next Visit Plan review and progress HEP, manual therapy/modalities PRN                   Patient will benefit from skilled therapeutic intervention in order to improve the following deficits and impairments:  Decreased activity tolerance, Decreased endurance, Decreased range of motion, Decreased strength, Increased fascial restricitons, Impaired UE functional use, Improper body mechanics,  Pain, Postural dysfunction, Impaired flexibility, Increased muscle spasms  Rennie Natter, PT, DPT  01/27/2022, 5:44 PM

## 2022-01-29 ENCOUNTER — Telehealth: Payer: Self-pay | Admitting: Family Medicine

## 2022-01-29 NOTE — Telephone Encounter (Signed)
Pt's wife called in stating pt has been vomiting since yesterday afternoon and severe diarrhea. Advised an appt or urgent care. Wife will discuss with pt and call us back. Originally wanted something just to be called in.

## 2022-01-31 ENCOUNTER — Encounter: Payer: Self-pay | Admitting: Family Medicine

## 2022-01-31 DIAGNOSIS — A084 Viral intestinal infection, unspecified: Secondary | ICD-10-CM | POA: Diagnosis not present

## 2022-02-01 ENCOUNTER — Other Ambulatory Visit: Payer: Self-pay | Admitting: Family Medicine

## 2022-02-01 MED ORDER — AMPHETAMINE-DEXTROAMPHETAMINE 30 MG PO TABS: 30.0000 mg | ORAL_TABLET | Freq: Two times a day (BID) | ORAL | 0 refills | Status: DC

## 2022-02-01 NOTE — Telephone Encounter (Signed)
Pt Requesting 60 tablets of this medication.  Pt states the pharmacy doesn't have XR, so regular will be fine.    LAST APPOINTMENT DATE:   11/03/21  NEXT APPOINTMENT DATE: 05/07/22   MEDICATION: amphetamine-dextroamphetamine (ADDERALL) 30 MG tablet [[id number]]  Is the patient out of medication?  Has one pill left.   PHARMACY: [id number] - [place], [place] AT [place] [address]  [place], [place] [address]  Phone:  [phone]  Fax:  [phone]

## 2022-02-01 NOTE — Telephone Encounter (Signed)
Message sent thru MyChart for pt/wife to keep [place] updated on the Urgent Care visit.

## 2022-02-01 NOTE — Telephone Encounter (Signed)
Pt was seen at an urgent care in [facility] on 01/31/22.

## 2022-02-10 ENCOUNTER — Ambulatory Visit: Payer: BC Managed Care – PPO | Attending: Family Medicine | Admitting: Physical Therapy

## 2022-03-10 ENCOUNTER — Other Ambulatory Visit: Payer: Self-pay | Admitting: Family Medicine

## 2022-03-10 MED ORDER — AMPHETAMINE-DEXTROAMPHETAMINE 30 MG PO TABS: 30.0000 mg | ORAL_TABLET | Freq: Two times a day (BID) | ORAL | 0 refills | Status: DC

## 2022-03-11 LAB — HM DIABETES EYE EXAM

## 2022-03-26 ENCOUNTER — Other Ambulatory Visit: Payer: Self-pay | Admitting: Family Medicine

## 2022-03-31 MED ORDER — AMPHETAMINE-DEXTROAMPHETAMINE 30 MG PO TABS: 30.0000 mg | ORAL_TABLET | Freq: Two times a day (BID) | ORAL | 0 refills | Status: DC

## 2022-03-31 NOTE — Telephone Encounter (Signed)
LAST APPOINTMENT DATE: 03/10/2022   NEXT APPOINTMENT DATE: 05/07/2022    LAST REFILL: 04/02/2022

## 2022-05-07 ENCOUNTER — Encounter: Payer: Self-pay | Admitting: Family Medicine

## 2022-05-07 ENCOUNTER — Ambulatory Visit: Payer: BC Managed Care – PPO | Admitting: Family Medicine

## 2022-05-07 VITALS — BP 128/84 | HR 88 | Temp 98.0°F | Ht 70.0 in | Wt 185.0 lb

## 2022-05-07 DIAGNOSIS — R7989 Other specified abnormal findings of blood chemistry: Secondary | ICD-10-CM | POA: Insufficient documentation

## 2022-05-07 DIAGNOSIS — F988 Other specified behavioral and emotional disorders with onset usually occurring in childhood and adolescence: Secondary | ICD-10-CM | POA: Diagnosis not present

## 2022-05-07 DIAGNOSIS — E1159 Type 2 diabetes mellitus with other circulatory complications: Secondary | ICD-10-CM | POA: Diagnosis not present

## 2022-05-07 DIAGNOSIS — F1021 Alcohol dependence, in remission: Secondary | ICD-10-CM

## 2022-05-07 DIAGNOSIS — E119 Type 2 diabetes mellitus without complications: Secondary | ICD-10-CM | POA: Diagnosis not present

## 2022-05-07 DIAGNOSIS — Z23 Encounter for immunization: Secondary | ICD-10-CM

## 2022-05-07 DIAGNOSIS — I152 Hypertension secondary to endocrine disorders: Secondary | ICD-10-CM

## 2022-05-07 LAB — POCT GLYCOSYLATED HEMOGLOBIN (HGB A1C): Hemoglobin A1C: 5.7 % — AB (ref 4.0–5.6)

## 2022-05-07 MED ORDER — AMPHETAMINE-DEXTROAMPHETAMINE 30 MG PO TABS: 30.0000 mg | ORAL_TABLET | Freq: Two times a day (BID) | ORAL | 0 refills | Status: DC

## 2022-05-07 NOTE — Patient Instructions (Signed)
Please return in 6 months for your annual complete physical; please come fasting.   I have sent in Adderall 30 twice a day for the next 3 months. You'll let me know when you need refills and when the '20mg'$  XR version is back in stock.   Congrats on everything!!! Super Happy for you.  If you have any questions or concerns, please don't hesitate to send me a message via MyChart or call the office at [phone]. Thank you for visiting with [place] today! It's our pleasure caring for you.

## 2022-05-07 NOTE — Progress Notes (Signed)
Subjective  CC:  Chief Complaint  Patient presents with   Diabetes    HPI: George Campbell is a 51 y.o. male who presents to the office today for follow up of diabetes and problems listed above in the chief complaint.  Diabetes follow up: His diabetic control is reported as Unchanged. Eating well. Weight is down and stable. Feels great. Diet controlled. Had normal eye exam within the last 6 months.  He denies exertional CP or SOB or symptomatic hypoglycemia. He denies foot sores or paresthesias. No statin indicated due to low ldl HTN: well controlled on amlodipine and zestoretic. No cp or sob.  ADD: does best on Adderall XR 20 bid but national shortage persists so using adderall 30 bid. Working ok. No concerns. Got a promotion at work! Remains alcohol free. No longer needing naltrexone  Wt Readings from Last 3 Encounters:  05/07/22 185 lb (83.9 kg)  01/20/22 186 lb 6.4 oz (84.6 kg)  12/09/21 192 lb 12.8 oz (87.5 kg)    BP Readings from Last 3 Encounters:  05/07/22 128/84  01/20/22 120/82  12/09/21 110/74    Assessment  1. Type 2 diabetes mellitus without complication, without long-term current use of insulin ([place])   2. Need for influenza vaccination   3. Alcoholism in recovery ([place])   4. Hypertension associated with type 2 diabetes mellitus ([place])   5. Attention deficit disorder (ADD) without hyperactivity   6. Low serum low density lipoprotein (LDL)      Plan  Diabetes is currently very well controlled. Continue healthy lifestyle. Flu shot updated today. On ace.  HTN is controlled. No change in meds ADD: This medical condition is well controlled. There are no signs of complications, medication side effects, or red flags. Patient is instructed to continue the current treatment plan without change in therapies or medications. Refilled adderall 20 bid x 3 months. Prefers XR 30 bid.    Follow up: 6 mo for cpe. Orders Placed This Encounter  Procedures   Flu Vaccine QUAD 6+ mos  PF IM (Fluarix Quad PF)   POCT HgB A1C   Meds ordered this encounter  Medications   amphetamine-dextroamphetamine (ADDERALL) 30 MG tablet    Sig: Take 1 tablet by mouth 2 (two) times daily.    Dispense:  60 tablet    Refill:  0   amphetamine-dextroamphetamine (ADDERALL) 30 MG tablet    Sig: Take 1 tablet by mouth 2 (two) times daily.    Dispense:  60 tablet    Refill:  0   amphetamine-dextroamphetamine (ADDERALL) 30 MG tablet    Sig: Take 1 tablet by mouth 2 (two) times daily.    Dispense:  60 tablet    Refill:  0      Immunization History  Administered Date(s) Administered   Hepatitis B, adult 06/21/2018, 07/24/2018   Influenza Inj Mdck Quad Pf 06/10/2017, 06/04/2019   Influenza Nasal 05/15/2013, 05/27/2014   Influenza, Quadrivalent, Recombinant, Inj, Pf 06/10/2017   Influenza, Seasonal, Injecte, Preservative Fre 06/02/2015, 05/31/2016   Influenza,inj,Quad PF,6+ Mos 06/21/2018, 05/02/2020, 05/07/2022   Influenza-Unspecified 06/21/2018, 06/04/2019, 07/14/2021   PFIZER(Purple Top)SARS-COV-2 Vaccination 10/25/2019, 11/22/2019, 05/29/2020, 04/10/2021   Pneumococcal Conjugate-13 08/05/2021   Pneumococcal Polysaccharide-23 09/14/2016   Tdap 09/06/2014, 06/10/2019    Diabetes Related Lab Review: Lab Results  Component Value Date   HGBA1C 5.7 (A) 05/07/2022   HGBA1C 5.9 11/03/2021   HGBA1C 5.6 08/05/2021    Lab Results  Component Value Date   MICROALBUR 0.9 11/03/2021  Lab Results  Component Value Date   CREATININE 0.75 11/03/2021   BUN 10 11/03/2021   NA 139 11/03/2021   K 3.8 11/03/2021   CL 102 11/03/2021   CO2 31 11/03/2021   Lab Results  Component Value Date   CHOL 118 11/03/2021   CHOL 108 10/09/2020   CHOL 137 10/09/2019   Lab Results  Component Value Date   HDL 54.90 11/03/2021   HDL 31.00 (L) 10/09/2020   HDL 43.20 10/09/2019   Lab Results  Component Value Date   LDLCALC 53 11/03/2021   LDLCALC 41 10/09/2020   LDLCALC 63 10/09/2019   Lab  Results  Component Value Date   TRIG 52.0 11/03/2021   TRIG 179.0 (H) 10/09/2020   TRIG 152.0 (H) 10/09/2019   Lab Results  Component Value Date   CHOLHDL 2 11/03/2021   CHOLHDL 3 10/09/2020   CHOLHDL 3 10/09/2019   No results found for: "LDLDIRECT" The ASCVD Risk score (Arnett DK, et al., 2019) failed to calculate for the following reasons:   The valid total cholesterol range is 130 to 320 mg/dL I have reviewed the PMH, Fam and Soc history. Patient Active Problem List   Diagnosis Date Noted   Low serum low density lipoprotein (LDL) 05/07/2022    Not on statin; LDL in the 30s naturally    Type 2 diabetes mellitus without complication, without long-term current use of insulin ([place]) 08/05/2021    Started metformin 09/2020; no statin due to excellent LDL < 60 naturally. Stopped met 06/2021, diet controlled    Alcoholism in recovery ([place]) 11/24/2017   Fatty liver, alcoholic 50/04/3817    Liver ultrasound 2017, [place].    Hypertension associated with type 2 diabetes mellitus ([place]) 03/25/2015    On ACE    Attention deficit disorder (ADD) without hyperactivity 04/17/2013    Overview:  Has used adderall in past - works well. 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     Social History: Patient  reports that he quit smoking about 6 years ago. His smoking use included cigarettes and e-cigarettes. He has a 45.00 pack-year smoking history. He has never used smokeless tobacco. He reports that he does not currently use alcohol. He reports current drug use. Drug: Marijuana.  Review of Systems: Ophthalmic: negative for eye pain, loss of vision or double vision Cardiovascular: negative for chest pain Respiratory: negative for SOB or persistent cough Gastrointestinal: negative for abdominal pain Genitourinary: negative for dysuria or gross hematuria MSK: negative for foot lesions Neurologic: negative for weakness or gait disturbance  Objective  Vitals: BP 128/84    Pulse 88   Temp 98 F (36.7 C)   Ht 5' 10"  (1.778 m)   Wt 185 lb (83.9 kg)   SpO2 99%   BMI 26.54 kg/m  General: well appearing, no acute distress  Psych:  Alert and oriented, normal mood and affect   Diabetic education: ongoing education regarding chronic disease management for diabetes was given today. We continue to reinforce the ABC's of diabetic management: A1c (<7 or 8 dependent upon patient), tight blood pressure control, and cholesterol management with goal LDL < 100 minimally. We discuss diet strategies, exercise recommendations, medication options and possible side effects. At each visit, we review recommended immunizations and preventive care recommendations for diabetics and stress that good diabetic control can prevent other problems. See below for this patient's data.   Commons side effects, risks, benefits, and alternatives for medications and treatment plan prescribed  today were discussed, and the patient expressed understanding of the given instructions. Patient is instructed to call or message via MyChart if he/she has any questions or concerns regarding our treatment plan. No barriers to understanding were identified. We discussed Red Flag symptoms and signs in detail. Patient expressed understanding regarding what to do in case of urgent or emergency type symptoms.  Medication list was reconciled, printed and provided to the patient in AVS. Patient instructions and summary information was reviewed with the patient as documented in the AVS. This note was prepared with assistance of Dragon voice recognition software. Occasional wrong-word or sound-a-like substitutions may have occurred due to the inherent limitations of voice recognition software  This visit occurred during the SARS-CoV-2 public health emergency.  Safety protocols were in place, including screening questions prior to the visit, additional usage of staff PPE, and extensive cleaning of exam room while observing  appropriate contact time as indicated for disinfecting solutions.

## 2022-06-07 ENCOUNTER — Telehealth: Payer: Self-pay | Admitting: Family Medicine

## 2022-06-07 NOTE — Telephone Encounter (Signed)
Patient states: - Pharmacy stated they do not have enough of amphetamine-dextroamphetamine (ADDERALL) 30 MG tablet to make up his prescribed order  - Pharmacy suggested he switch to Adderall XR 30 mg since they have enough of that in stock to fill for him   Patient requests: - Adderall XR 30 mg be sent to [facility] at [address], [place], [place] [address]

## 2022-06-08 MED ORDER — AMPHETAMINE-DEXTROAMPHET ER 30 MG PO CP24: 30.0000 mg | ORAL_CAPSULE | Freq: Two times a day (BID) | ORAL | 0 refills | Status: DC

## 2022-07-06 ENCOUNTER — Other Ambulatory Visit: Payer: Self-pay | Admitting: Family Medicine

## 2022-07-26 ENCOUNTER — Telehealth: Payer: Self-pay | Admitting: Family Medicine

## 2022-07-26 NOTE — Telephone Encounter (Signed)
LAST APPOINTMENT DATE: 07/06/2022   NEXT APPOINTMENT DATE: 11/05/2022

## 2022-07-26 NOTE — Telephone Encounter (Signed)
Unable to contact patient, no voice mail  

## 2022-07-26 NOTE — Telephone Encounter (Signed)
Pt states: He wants to go back to the '20mg'$  XR 2 times per day. -He thinks this dosage works better for him. -[facility] in [place], [place] on [address] is [address]. -He will clear out his voicemail for any needed follow up calls.

## 2022-07-27 ENCOUNTER — Other Ambulatory Visit: Payer: Self-pay | Admitting: Family Medicine

## 2022-07-28 MED ORDER — AMPHETAMINE-DEXTROAMPHET ER 20 MG PO CP24: 20.0000 mg | ORAL_CAPSULE | Freq: Two times a day (BID) | ORAL | 0 refills | Status: DC

## 2022-09-24 ENCOUNTER — Telehealth: Payer: Self-pay | Admitting: Family Medicine

## 2022-09-24 MED ORDER — AMPHETAMINE-DEXTROAMPHET ER 20 MG PO CP24: 20.0000 mg | ORAL_CAPSULE | Freq: Two times a day (BID) | ORAL | 0 refills | Status: DC

## 2022-09-24 NOTE — Telephone Encounter (Signed)
Patient states: George Campbell to pick up adderall from pharmacy but they don't have an rx for January. They only received rx order for December and February.   Patient requests med refill be sent to [facility] at [address] [place], [place] [id number]

## 2022-09-27 MED ORDER — AMPHETAMINE-DEXTROAMPHET ER 20 MG PO CP24: 20.0000 mg | ORAL_CAPSULE | Freq: Two times a day (BID) | ORAL | 0 refills | Status: DC

## 2022-09-27 NOTE — Addendum Note (Signed)
Addended by: Billey Chang on: 09/27/2022 09:37 AM   Modules accepted: Orders

## 2022-10-27 ENCOUNTER — Telehealth: Payer: Self-pay | Admitting: Family Medicine

## 2022-10-27 ENCOUNTER — Encounter: Payer: Self-pay | Admitting: Family Medicine

## 2022-10-27 MED ORDER — AMPHETAMINE-DEXTROAMPHETAMINE 20 MG PO TABS: 20.0000 mg | ORAL_TABLET | Freq: Two times a day (BID) | ORAL | 0 refills | Status: DC

## 2022-10-27 NOTE — Telephone Encounter (Signed)
Please send '20mg'$  regular, not extended release, pharmacy cannot get the ER.    Encourage patient to contact the pharmacy for refills or they can request refills through [place]:  Please schedule appointment if longer than 1 year  NEXT APPOINTMENT DATE:  MEDICATION:  amphetamine-dextroamphetamine (ADDERALL XR) 20 MG 24 hr capsule   Is the patient out of medication? Almost  PHARMACY:  [id number] - [place], [place] AT [place] [address] Phone: [phone]  Fax: [phone]      Let patient know to contact pharmacy at the end of the day to make sure medication is ready.  Please notify patient to allow 48-72 hours to process

## 2022-11-05 ENCOUNTER — Ambulatory Visit (INDEPENDENT_AMBULATORY_CARE_PROVIDER_SITE_OTHER): Payer: BC Managed Care – PPO | Admitting: Family Medicine

## 2022-11-05 ENCOUNTER — Encounter: Payer: Self-pay | Admitting: Family Medicine

## 2022-11-05 VITALS — BP 110/72 | HR 79 | Temp 98.1°F | Ht 70.0 in | Wt 191.0 lb

## 2022-11-05 DIAGNOSIS — E119 Type 2 diabetes mellitus without complications: Secondary | ICD-10-CM

## 2022-11-05 DIAGNOSIS — F1021 Alcohol dependence, in remission: Secondary | ICD-10-CM

## 2022-11-05 DIAGNOSIS — E1159 Type 2 diabetes mellitus with other circulatory complications: Secondary | ICD-10-CM

## 2022-11-05 DIAGNOSIS — Z0000 Encounter for general adult medical examination without abnormal findings: Secondary | ICD-10-CM | POA: Diagnosis not present

## 2022-11-05 DIAGNOSIS — Z87891 Personal history of nicotine dependence: Secondary | ICD-10-CM | POA: Insufficient documentation

## 2022-11-05 DIAGNOSIS — F988 Other specified behavioral and emotional disorders with onset usually occurring in childhood and adolescence: Secondary | ICD-10-CM | POA: Diagnosis not present

## 2022-11-05 DIAGNOSIS — I152 Hypertension secondary to endocrine disorders: Secondary | ICD-10-CM

## 2022-11-05 DIAGNOSIS — Z23 Encounter for immunization: Secondary | ICD-10-CM

## 2022-11-05 DIAGNOSIS — Z122 Encounter for screening for malignant neoplasm of respiratory organs: Secondary | ICD-10-CM

## 2022-11-05 LAB — CBC WITH DIFFERENTIAL/PLATELET
Basophils Absolute: 0 10*3/uL (ref 0.0–0.1)
Basophils Relative: 0.5 % (ref 0.0–3.0)
Eosinophils Absolute: 0 10*3/uL (ref 0.0–0.7)
Eosinophils Relative: 0.5 % (ref 0.0–5.0)
HCT: 44 % (ref 39.0–52.0)
Hemoglobin: 15.1 g/dL (ref 13.0–17.0)
Lymphocytes Relative: 25.4 % (ref 12.0–46.0)
Lymphs Abs: 2.1 10*3/uL (ref 0.7–4.0)
MCHC: 34.2 g/dL (ref 30.0–36.0)
MCV: 84.8 fl (ref 78.0–100.0)
Monocytes Absolute: 0.9 10*3/uL (ref 0.1–1.0)
Monocytes Relative: 10.8 % (ref 3.0–12.0)
Neutro Abs: 5.3 10*3/uL (ref 1.4–7.7)
Neutrophils Relative %: 62.8 % (ref 43.0–77.0)
Platelets: 352 10*3/uL (ref 150.0–400.0)
RBC: 5.19 Mil/uL (ref 4.22–5.81)
RDW: 13.6 % (ref 11.5–15.5)
WBC: 8.4 10*3/uL (ref 4.0–10.5)

## 2022-11-05 LAB — LIPID PANEL
Cholesterol: 108 mg/dL (ref 0–200)
HDL: 49.4 mg/dL (ref >39.00)
LDL Cholesterol: 48 mg/dL (ref 0–99)
NonHDL: 58.94
Total CHOL/HDL Ratio: 2
Triglycerides: 56 mg/dL (ref 0.0–149.0)
VLDL: 11.2 mg/dL (ref 0.0–40.0)

## 2022-11-05 LAB — MICROALBUMIN / CREATININE URINE RATIO
Creatinine,U: 0 mg/dL
Microalb Creat Ratio: 0 mg/g (ref 0.0–30.0)
Microalb, Ur: <0.7 mg/dL (ref 0.0–1.9)

## 2022-11-05 LAB — COMPREHENSIVE METABOLIC PANEL
ALT: 17 U/L (ref 0–53)
AST: 22 U/L (ref 0–37)
Albumin: 4.5 g/dL (ref 3.5–5.2)
Alkaline Phosphatase: 65 U/L (ref 39–117)
BUN: 11 mg/dL (ref 6–23)
CO2: 33 mEq/L — ABNORMAL HIGH (ref 19–32)
Calcium: 10.2 mg/dL (ref 8.4–10.5)
Chloride: 98 mEq/L (ref 96–112)
Creatinine, Ser: 0.77 mg/dL (ref 0.40–1.50)
GFR: 104.74 mL/min (ref >60.00)
Glucose, Bld: 108 mg/dL — ABNORMAL HIGH (ref 70–99)
Potassium: 4.2 mEq/L (ref 3.5–5.1)
Sodium: 138 mEq/L (ref 135–145)
Total Bilirubin: 0.8 mg/dL (ref 0.2–1.2)
Total Protein: 7.5 g/dL (ref 6.0–8.3)

## 2022-11-05 LAB — HEMOGLOBIN A1C: Hgb A1c MFr Bld: 5.9 % (ref 4.6–6.5)

## 2022-11-05 MED ORDER — AMLODIPINE BESYLATE 10 MG PO TABS: 10.0000 mg | ORAL_TABLET | Freq: Every day | ORAL | 3 refills | Status: DC

## 2022-11-05 MED ORDER — LISINOPRIL-HYDROCHLOROTHIAZIDE 20-12.5 MG PO TABS: 2.0000 | ORAL_TABLET | Freq: Every day | ORAL | 3 refills | Status: DC

## 2022-11-05 NOTE — Progress Notes (Signed)
Subjective  Chief Complaint  Patient presents with   Annual Exam    Pt here for Annual Exam and is not currently fasting     HPI: George Campbell is a 52 y.o. male who presents to [facility] [place] today for a Male Wellness Visit. He also has the concerns and/or needs as listed above in the chief complaint. These will be addressed in addition to the Health Maintenance Visit.   Wellness Visit: annual visit with health maintenance review and exam   Health maintenance: Colorectal screening is current with negative Cologuard done 2 years ago.  Eligible for lung screening now age 75.  30-pack-year history and quit in 2017.  No known lung disease.  Eligible for Shingrix today.  Remains alcohol and tobacco free.  Doing well, gained a few pounds during Christmas and is now working to lose that.  Eye exam is current.  Body mass index is 27.41 kg/m. Wt Readings from Last 3 Encounters:  11/05/22 191 lb (86.6 kg)  05/07/22 185 lb (83.9 kg)  01/20/22 186 lb 6.4 oz (84.6 kg)     Chronic disease management visit and/or acute problem visit: Diet-controlled diabetes: He does complain of polyuria but notes that he has been drinking more water.  No polydipsia or blurred vision.  Eats a diabetic diet.  No foot concerns.  Keep an eye on his callus.  Works on his feet all day.  On an ACE inhibitor.  Immunizations current for flu and pneumonia Hypertension is well-controlled on lisinopril HCTZ and amlodipine.  No chest pain.  No lower extremity edema. History of fatty liver due to alcoholism.  Most recent LFTs have been normal ADD: Remains well-controlled.  He has been needing to shuffle his dose due to Adderall shortage.  Currently using Adderall 20 IR twice daily and this is working well.  He has been on Adderall 30 ER daily.  No adverse effects.  Patient Active Problem List   Diagnosis Date Noted   Former smoker 30 pak year quit 2017 11/05/2022   Low serum low density lipoprotein  (LDL) 05/07/2022   Type 2 diabetes mellitus without complication, without long-term current use of insulin ([place]) 08/05/2021   Alcoholism in recovery ([place]) 11/24/2017   Fatty liver, alcoholic [id number]   Hypertension associated with type 2 diabetes mellitus ([place]) 03/25/2015   Attention deficit disorder (ADD) without hyperactivity 04/17/2013   Health Maintenance  Topic Date Due   Lung Cancer Screening  Never done   Zoster Vaccines- Shingrix (1 of 2) Never done   Diabetic kidney evaluation - eGFR measurement  11/04/2022   Diabetic kidney evaluation - Urine ACR  11/04/2022   HEMOGLOBIN A1C  11/05/2022   COVID-19 Vaccine (5 - 2023-24 season) 11/21/2022 (Originally 04/30/2022)   OPHTHALMOLOGY EXAM  03/12/2023   FOOT EXAM  11/05/2023   Fecal DNA (Cologuard)  04/21/2024   DTaP/Tdap/Td (3 - Td or Tdap) 06/09/2029   INFLUENZA VACCINE  Completed   Hepatitis C Screening  Completed   HIV Screening  Completed   HPV VACCINES  Aged Out   Immunization History  Administered Date(s) Administered   Hepatitis B, ADULT 06/21/2018, 07/24/2018   Influenza Inj Mdck Quad Pf 06/10/2017, 06/04/2019   Influenza Nasal 05/15/2013, 05/27/2014   Influenza, Quadrivalent, Recombinant, Inj, Pf 06/10/2017   Influenza, Seasonal, Injecte, Preservative Fre 06/02/2015, 05/31/2016   Influenza,inj,Quad PF,6+ Mos 06/21/2018, 05/02/2020, 05/07/2022   Influenza-Unspecified 06/21/2018, 06/04/2019, 07/14/2021   PFIZER(Purple Top)SARS-COV-2 Vaccination 10/25/2019, 11/22/2019, 05/29/2020, 04/10/2021  Pneumococcal Conjugate-13 08/05/2021   Pneumococcal Polysaccharide-23 09/14/2016   Tdap 09/06/2014, 06/10/2019   We updated and reviewed the patient's past history in detail and it is documented below. Allergies: Patient is allergic to strattera [atomoxetine hcl]. Past Medical History  has a past medical history of Anxiety, Depression, Hypertension, Inguinal hernia, and Substance abuse ([place]) (02 2019). Past Surgical  History Patient  has a past surgical history that includes Knee arthrodesis (Right, 2002); Mouth surgery; Hernia repair; and XI Robotic assisted inguinal hernia repair with mesh (Bilateral, 09/25/2019). Social History Patient  reports that he quit smoking about 6 years ago. His smoking use included cigarettes and e-cigarettes. He has a 45.00 pack-year smoking history. He has never used smokeless tobacco. He reports that he does not currently use alcohol. He reports current drug use. Drug: Marijuana. Family History family history includes ADD / ADHD in his mother; Alcohol abuse in his father; Esophageal cancer in his maternal uncle; Healthy in his paternal uncle and son; Hypertension in his mother. Review of Systems: Constitutional: negative for fever or malaise Ophthalmic: negative for photophobia, double vision or loss of vision Cardiovascular: negative for chest pain, dyspnea on exertion, or new LE swelling Respiratory: negative for SOB or persistent cough Gastrointestinal: negative for abdominal pain, change in bowel habits or melena Genitourinary: negative for dysuria or gross hematuria Musculoskeletal: negative for new gait disturbance or muscular weakness Integumentary: negative for new or persistent rashes Neurological: negative for TIA or stroke symptoms Psychiatric: negative for SI or delusions Allergic/Immunologic: negative for hives  Patient Care Team    Relationship Specialty Notifications Start End  Leamon Arnt, MD PCP - General Family Medicine  09/26/17   Clovis Riley, MD Consulting Physician General Surgery  09/26/19    Objective  Vitals: BP 110/72   Pulse 79   Temp 98.1 F (36.7 C)   Ht '5\' 10"'$  (1.778 m)   Wt 191 lb (86.6 kg)   SpO2 97%   BMI 27.41 kg/m  General:  Well developed, well nourished, no acute distress, looks great Psych:  Alert and orientedx3,normal mood and affect HEENT:  Normocephalic, atraumatic, non-icteric sclera, PERRL, supple neck without  adenopathy, mass or thyromegaly Cardiovascular:  Normal S1, S2, RRR without gallop, rub or murmur Respiratory:  Good breath sounds bilaterally, CTAB with normal respiratory effort Gastrointestinal: normal bowel sounds, soft, non-tender, no noted masses. No HSM MSK: no deformities, contusions. Joints are without erythema or swelling.  Neurologic:    Mental status is normal. Gross motor and sensory exams are normal. Stable gait. No tremor Diabetic Foot Exam: Appearance - no lesions, ulcers or calluses Skin - no sigificant pallor or erythema Monofilament testing - sensitive bilaterally in following locations:  Right - Great toe, medial, central, lateral ball and posterior foot intact  Left - Great toe, medial, central, lateral ball and posterior foot intact Pulses - +2 distally bilaterally    Assessment  1. Annual physical exam   2. Hypertension associated with type 2 diabetes mellitus ([place])   3. Type 2 diabetes mellitus without complication, without long-term current use of insulin ([place])   4. Attention deficit disorder (ADD) without hyperactivity   5. Alcoholism in recovery ([place])   6. Former smoker   7. Screening for lung cancer      Plan  Male Wellness Visit: Age appropriate Health Maintenance and Prevention measures were discussed with patient. Included topics are cancer screening recommendations, ways to keep healthy (see AVS) including dietary and exercise recommendations, regular eye and dental  care, use of seat belts, and avoidance of moderate alcohol use and tobacco use.  Lung cancer screening referral placed.  Education given. BMI: discussed patient's BMI and encouraged positive lifestyle modifications to help get to or maintain a target BMI. HM needs and immunizations were addressed and ordered. See below for orders. See HM and immunization section for updates.  Shingrix 1 given today. Routine labs and screening tests ordered including cmp, cbc and lipids where  appropriate. Discussed recommendations regarding Vit D and calcium supplementation (see AVS)  Chronic disease f/u and/or acute problem visit: (deemed necessary to be done in addition to the wellness visit): Diet-controlled diabetes: Check A1c.  Mild polyuria but mostly related to increased fluid intake.  Check urine nephropathy screen on ACE. Hypertension: Continue amlodipine 10 and lisinopril HCTZ 1025 daily.  Check renal function electrolytes. ADD is well-controlled on Adderall.  Current send 20 twice daily because of shortage.  Ideal dosing Adderall XR 30 daily History of obesity: Working on weight loss.  Doing a great job Remains tobacco and alcohol free  Follow up: 6 months for recheck and second Shingrix [place] side effects, risks, benefits, and alternatives for medications and treatment plan prescribed today were discussed, and the patient expressed understanding of the given instructions. Patient is instructed to call or message via MyChart if he/she has any questions or concerns regarding our treatment plan. No barriers to understanding were identified. We discussed Red Flag symptoms and signs in detail. Patient expressed understanding regarding what to do in case of urgent or emergency type symptoms.  Medication list was reconciled, printed and provided to the patient in AVS. Patient instructions and summary information was reviewed with the patient as documented in the AVS. This note was prepared with assistance of Dragon voice recognition software. Occasional wrong-word or sound-a-like substitutions may have occurred due to the inherent limitations of voice recognition software    Orders Placed This Encounter  Procedures   Zoster Recombinant (Shingrix )   Hemoglobin A1c   CBC with Differential/Platelet   Comprehensive metabolic panel   Lipid panel   Microalbumin / creatinine urine ratio   Ambulatory Referral for Lung Cancer Scre   Meds ordered this encounter  Medications    amLODipine (NORVASC) 10 MG tablet    Sig: Take 1 tablet (10 mg total) by mouth daily.    Dispense:  90 tablet    Refill:  3   lisinopril-hydrochlorothiazide (ZESTORETIC) 20-12.5 MG tablet    Sig: Take 2 tablets by mouth daily.    Dispense:  180 tablet    Refill:  3

## 2022-11-05 NOTE — Patient Instructions (Signed)
Please return in 6 months for recheck and 2nd Shingrix vaccination.  I will release your lab results to you on your MyChart account with further instructions. You may see the results before I do, but when I review them I will send you a message with my report or have my assistant call you if things need to be discussed. Please reply to my message with any questions. Thank you!   We will call you to get you set up for lung cancer screening.   If you have any questions or concerns, please don't hesitate to send me a message via MyChart or call the office at [phone]. Thank you for visiting with [place] today! It's our pleasure caring for you.   Lung Cancer Screening A lung cancer screening is a test that checks for lung cancer when there are no symptoms or history of that disease. The screening is done to look for lung cancer in its very early stages. Finding cancer early improves the chances of successful treatment. It may save your life. Who should have a screening? You should be screened for lung cancer if all of these apply: You currently smoke, or you have quit smoking within the past 15 years. You are between the ages of 4 and 33 years old. Screening may be recommended up to age 53 depending on your overall health and other factors. You have a smoking history of 1 pack of cigarettes a day for 20 years or 2 packs a day for 10 years. How is screening done?  The recommended screening test is a low-dose computed tomography (LDCT) scan. This scan takes detailed images of the lungs. This allows a health care provider to look for abnormal cells. If you are at risk for lung cancer, it is recommended that you get screened once a year. Talk to your health care provider about the risks, benefits, and limitations of screening. What are the benefits of screening? Screening can find lung cancer early, before symptoms start and before it has spread outside of the lungs. The chances of curing lung cancer are  greater if the cancer is diagnosed early. What are the risks of screening? The screening may show lung cancer when no cancer is present. Talk with your health care provider about what your results mean. In some cases, your health care provider may do more testing to confirm the results. The screening may not find lung cancer when it is present. You will be exposed to radiation from repeated LDCT tests, which can cause cancer in otherwise healthy people. How can I lower my risk of lung cancer? Make these lifestyle changes to lower your risk of developing lung cancer: Do not use any products that contain nicotine or tobacco. These products include cigarettes, chewing tobacco, and vaping devices, such as e-cigarettes. If you need help quitting, ask your health care provider. Avoid secondhand smoke. Avoid exposure to radiation. Avoid exposure to radon gas. Have your home checked for radon regularly. Avoid things that cause cancer (carcinogens). Avoid living or working in places with high air pollution or diesel exhaust. Questions to ask your health care provider Am I eligible for lung cancer screening? Does my health insurance cover the cost of lung cancer screening? What happens if the lung cancer screening shows something of concern? How soon will I have results from my lung cancer screening? Is there anything that I need to do to prepare for my lung cancer screening? What happens if I decide not to have lung cancer  screening? Where to find more information Ask your health care provider about the risks and benefits of screening. More information and resources are available from these organizations: [place] (ACS): www.cancer.org American Lung Association: www.lung.[place]: www.cancer.gov Contact a health care provider if: You start to show symptoms of lung cancer, including: A cough that will not go away. High-pitched whistling sounds when you breathe,  most often when you breathe out (wheezing). Chest pain. Coughing up blood. Shortness of breath. Weight loss that cannot be explained. Constant tiredness (fatigue). Hoarse voice. Summary Lung cancer screening may find lung cancer before symptoms appear. Finding cancer early improves the chances of successful treatment. It may save your life. The recommended screening test is a low-dose computed tomography (LDCT) scan that looks for abnormal cells in the lungs. If you are at risk for lung cancer, it is recommended that you get screened once a year. You can make lifestyle changes to lower your risk of lung cancer. Ask your health care provider about the risks and benefits of screening. This information is not intended to replace advice given to you by your health care provider. Make sure you discuss any questions you have with your health care provider. Document Revised: 02/04/2021 Document Reviewed: 02/04/2021 Elsevier Patient Education  [place].

## 2022-11-22 ENCOUNTER — Encounter: Payer: Self-pay | Admitting: Family Medicine

## 2022-11-22 NOTE — Telephone Encounter (Signed)
Patient called to ensure message go through. Request is as seen below.

## 2022-11-23 MED ORDER — AMPHETAMINE-DEXTROAMPHET ER 30 MG PO CP24: 30.0000 mg | ORAL_CAPSULE | Freq: Two times a day (BID) | ORAL | 0 refills | Status: DC

## 2022-12-20 ENCOUNTER — Other Ambulatory Visit: Payer: Self-pay | Admitting: Family Medicine

## 2022-12-20 MED ORDER — AMPHETAMINE-DEXTROAMPHET ER 30 MG PO CP24: 30.0000 mg | ORAL_CAPSULE | Freq: Two times a day (BID) | ORAL | 0 refills | Status: DC

## 2022-12-20 NOTE — Telephone Encounter (Signed)
LOV: 11/05/2022  Last Fill date: 09/24/2022  Qty: 60  Refills: 0

## 2022-12-20 NOTE — Telephone Encounter (Signed)
Prescription Request  12/20/2022  LOV: 11/05/2022  What is the name of the medication or equipment?  amphetamine-dextroamphetamine (ADDERALL XR) 30 MG 24 hr capsule   Have you contacted your pharmacy to request a refill? No   Which pharmacy would you like this sent to?  [id number] - [place], [place] - [address] [place] [address] Phone: [phone] Fax: [phone]   Patient notified that their request is being sent to the clinical staff for review and that they should receive a response within 2 business days.   Please advise at Mobile [phone] (mobile)

## 2023-01-17 ENCOUNTER — Other Ambulatory Visit: Payer: Self-pay | Admitting: Family Medicine

## 2023-01-17 MED ORDER — AMPHETAMINE-DEXTROAMPHET ER 30 MG PO CP24: 30.0000 mg | ORAL_CAPSULE | Freq: Two times a day (BID) | ORAL | 0 refills | Status: DC

## 2023-01-17 NOTE — Telephone Encounter (Signed)
Last ov: 11/05/2022 Last refill: 12/20/2022 Disp:  60 capsules    Refill : 0

## 2023-02-09 ENCOUNTER — Other Ambulatory Visit: Payer: Self-pay | Admitting: *Deleted

## 2023-02-09 ENCOUNTER — Other Ambulatory Visit: Payer: Self-pay | Admitting: Family Medicine

## 2023-02-09 DIAGNOSIS — Z87891 Personal history of nicotine dependence: Secondary | ICD-10-CM

## 2023-02-09 DIAGNOSIS — Z122 Encounter for screening for malignant neoplasm of respiratory organs: Secondary | ICD-10-CM

## 2023-02-09 MED ORDER — AMPHETAMINE-DEXTROAMPHET ER 30 MG PO CP24: 30.0000 mg | ORAL_CAPSULE | Freq: Two times a day (BID) | ORAL | 0 refills | Status: DC

## 2023-02-09 NOTE — Telephone Encounter (Signed)
LOV: 11/05/2022   Last refill date: 09/22/2022  Qty: 60  Refills: 0

## 2023-03-02 ENCOUNTER — Encounter: Payer: Self-pay | Admitting: Physician Assistant

## 2023-03-02 ENCOUNTER — Ambulatory Visit (HOSPITAL_BASED_OUTPATIENT_CLINIC_OR_DEPARTMENT_OTHER)
Admission: RE | Admit: 2023-03-02 | Discharge: 2023-03-02 | Disposition: A | Discharge status: Home / Self Care | Payer: BC Managed Care – PPO | Source: Ambulatory Visit | Attending: Acute Care | Admitting: Acute Care

## 2023-03-02 ENCOUNTER — Ambulatory Visit (INDEPENDENT_AMBULATORY_CARE_PROVIDER_SITE_OTHER): Payer: BC Managed Care – PPO | Admitting: Physician Assistant

## 2023-03-02 DIAGNOSIS — Z87891 Personal history of nicotine dependence: Secondary | ICD-10-CM | POA: Insufficient documentation

## 2023-03-02 DIAGNOSIS — Z122 Encounter for screening for malignant neoplasm of respiratory organs: Secondary | ICD-10-CM | POA: Insufficient documentation

## 2023-03-02 NOTE — Progress Notes (Signed)
Virtual Visit via Telephone Note  I connected with George Campbell on 03/02/23 at 10:54 AM by telephone and verified that I am speaking with the correct person using two identifiers.  Location: Patient: home Provider: working virtually from home   I discussed the limitations, risks, security and privacy concerns of performing an evaluation and management service by telephone and the availability of in person appointments. I also discussed with the patient that there may be a patient responsible charge related to this service. The patient expressed understanding and agreed to proceed.       Shared Decision Making Visit Lung Cancer Screening Program [phone])   Eligibility: Age 52 Pack Years Smoking History Calculation 45 (# packs/per year x # years smoked) Recent History of coughing up blood  no Unexplained weight loss? No ( >Than 15 pounds within the last 6 months ) Prior History Lung / other cancer No (Diagnosis within the last 5 years already requiring surveillance chest CT Scans). Smoking Status Former Smoker Former Smokers: Years since quit: 6 years  Quit Date: 2018  Visit Components: Discussion included one or more decision making aids? Yes Discussion included risk/benefits of screening. Yes Discussion included potential follow up diagnostic testing for abnormal scans. Yes Discussion included meaning and risk of over diagnosis. Yes Discussion included meaning and risk of False Positives. Yes Discussion included meaning of total radiation exposure. Yes  Counseling Included: Importance of adherence to annual lung cancer LDCT screening. Yes Impact of comorbidities on ability to participate in the program. Yes Ability and willingness to under diagnostic treatment. Yes  Smoking Cessation Counseling: Former Smokers:  Discussed the importance of maintaining cigarette abstinence. Yes Diagnosis Code: Personal History of Nicotine Dependence. U04.540 Information about tobacco  cessation classes and interventions provided to patient. Yes Written Order for Lung Cancer Screening with LDCT placed in Epic. Yes (CT Chest Lung Cancer Screening Low Dose W/O CM) [id number] Z12.2-Screening of respiratory organs Z87.891-Personal history of nicotine dependence    I spent 25 minutes of face to face time/virtual visit time  with the patient discussing the risks and benefits of lung cancer screening. We took the time to pause at intervals to allow for questions to be asked and answered to ensure understanding. We discussed that they had taken the single most powerful action possible to decrease their risk of developing lung cancer when they quit smoking. I counseled them to remain smoke free, and to contact the office if they ever had the desire to smoke again so that we can provide resources and tools to help support the effort to remain smoke free. We discussed the time and location of the scan, and they  will receive a call or letter with the results within  24-72 hours of receiving them. They have the office contact information in the event they have questions.   They verbalized understanding of all of the above and had no further questions.    I explained to the patient that there has been a high incidence of coronary artery disease noted on these exams. I explained that this is a non-gated exam therefore degree or severity cannot be determined. This patient is not on statin therapy. I have asked the patient to follow-up with their PCP regarding any incidental finding of coronary artery disease and management with diet or medication as they feel is clinically indicated. The patient verbalized understanding of the above and had no further questions.      Darcella Gasman Kinley Dozier, PA-C

## 2023-03-02 NOTE — Patient Instructions (Signed)
Thank you for participating in the [facility] Lung Cancer Screening Program. It was our pleasure to meet you today. We will call you with the results of your scan within the next few days. Your scan will be assigned a Lung RADS category score by the physicians reading the scans.  This Lung RADS score determines follow up scanning.  See below for description of categories, and follow up screening recommendations. We will be in touch to schedule your follow up screening annually or based on recommendations of our providers. We will fax a copy of your scan results to your Primary Care Physician, or the physician who referred you to the program, to ensure they have the results. Please call the office if you have any questions or concerns regarding your scanning experience or results.  Our office number is [phone]. Please speak with Denise Phelps, RN. , or  Denise Buckner RN, They are  our Lung Cancer Screening RN.'s If They are unavailable when you call, Please leave a message on the voice mail. We will return your call at our earliest convenience.This voice mail is monitored several times a day.  Remember, if your scan is normal, we will scan you annually as long as you continue to meet the criteria for the program. (Age 50-80, Current smoker or smoker who has quit within the last 15 years). If you are a smoker, remember, quitting is the single most powerful action that you can take to decrease your risk of lung cancer and other pulmonary, breathing related problems. We know quitting is hard, and we are here to help.  Please let us know if there is anything we can do to help you meet your goal of quitting. If you are a former smoker, congratulations. We are proud of you! Remain smoke free! Remember you can refer friends or family members through the number above.  We will screen them to make sure they meet criteria for the program. Thank you for helping us take better care of you by  participating in Lung Screening.  You can receive free nicotine replacement therapy ( patches, gum or mints) by calling 1-800-QUIT NOW. Please call so we can get you on the path to becoming  a non-smoker. I know it is hard, but you can do this!  Lung RADS Categories:  Lung RADS 1: no nodules or definitely non-concerning nodules.  Recommendation is for a repeat annual scan in 12 months.  Lung RADS 2:  nodules that are non-concerning in appearance and behavior with a very low likelihood of becoming an active cancer. Recommendation is for a repeat annual scan in 12 months.  Lung RADS 3: nodules that are probably non-concerning , includes nodules with a low likelihood of becoming an active cancer.  Recommendation is for a 6-month repeat screening scan. Often noted after an upper respiratory illness. We will be in touch to make sure you have no questions, and to schedule your 6-month scan.  Lung RADS 4 A: nodules with concerning findings, recommendation is most often for a follow up scan in 3 months or additional testing based on our provider's assessment of the scan. We will be in touch to make sure you have no questions and to schedule the recommended 3 month follow up scan.  Lung RADS 4 B:  indicates findings that are concerning. We will be in touch with you to schedule additional diagnostic testing based on our provider's  assessment of the scan.  Other options for assistance in smoking cessation (   As covered by your insurance benefits)  Hypnosis for smoking cessation  [employer] [phone]  Acupuncture for smoking cessation  [facility] [phone]   

## 2023-03-07 ENCOUNTER — Other Ambulatory Visit: Payer: Self-pay

## 2023-03-07 DIAGNOSIS — Z87891 Personal history of nicotine dependence: Secondary | ICD-10-CM

## 2023-03-07 DIAGNOSIS — Z122 Encounter for screening for malignant neoplasm of respiratory organs: Secondary | ICD-10-CM

## 2023-03-09 ENCOUNTER — Other Ambulatory Visit: Payer: Self-pay | Admitting: Family Medicine

## 2023-03-09 MED ORDER — AMPHETAMINE-DEXTROAMPHET ER 30 MG PO CP24: 30.0000 mg | ORAL_CAPSULE | Freq: Two times a day (BID) | ORAL | 0 refills | Status: DC

## 2023-03-09 NOTE — Telephone Encounter (Signed)
LOV: 11/05/2022  Last Refill Date: 09/24/2022  Qty: 60  Refills: 0

## 2023-04-04 ENCOUNTER — Other Ambulatory Visit: Payer: Self-pay | Admitting: Family Medicine

## 2023-04-06 MED ORDER — AMPHETAMINE-DEXTROAMPHET ER 30 MG PO CP24: 30.0000 mg | ORAL_CAPSULE | Freq: Two times a day (BID) | ORAL | 0 refills | Status: DC

## 2023-04-14 ENCOUNTER — Encounter (INDEPENDENT_AMBULATORY_CARE_PROVIDER_SITE_OTHER): Payer: Self-pay

## 2023-05-03 ENCOUNTER — Ambulatory Visit (INDEPENDENT_AMBULATORY_CARE_PROVIDER_SITE_OTHER): Payer: BC Managed Care – PPO

## 2023-05-03 ENCOUNTER — Other Ambulatory Visit: Payer: Self-pay

## 2023-05-03 ENCOUNTER — Other Ambulatory Visit: Payer: Self-pay | Admitting: Family Medicine

## 2023-05-03 DIAGNOSIS — Z23 Encounter for immunization: Secondary | ICD-10-CM | POA: Diagnosis not present

## 2023-05-03 NOTE — Progress Notes (Signed)
Pt came in for a Nurse visit to receive his Shingles vaccine. Administered in the Rt deltoid area without any complaints. Also wanted to receive his Flu vaccine while he was here. Administered in the Lt deltoid area.

## 2023-05-04 MED ORDER — AMPHETAMINE-DEXTROAMPHET ER 30 MG PO CP24: 30.0000 mg | ORAL_CAPSULE | Freq: Every day | ORAL | 0 refills | Status: DC

## 2023-05-04 NOTE — Telephone Encounter (Signed)
Please notify pt that he is now due for a visit for ADD f/u. Please have him schedule.

## 2023-05-25 ENCOUNTER — Ambulatory Visit: Payer: BC Managed Care – PPO | Admitting: Family Medicine

## 2023-05-25 ENCOUNTER — Encounter: Payer: Self-pay | Admitting: Family Medicine

## 2023-05-25 VITALS — BP 122/72 | HR 83 | Temp 98.2°F | Ht 70.0 in | Wt 200.2 lb

## 2023-05-25 DIAGNOSIS — F988 Other specified behavioral and emotional disorders with onset usually occurring in childhood and adolescence: Secondary | ICD-10-CM

## 2023-05-25 DIAGNOSIS — E1159 Type 2 diabetes mellitus with other circulatory complications: Secondary | ICD-10-CM | POA: Diagnosis not present

## 2023-05-25 DIAGNOSIS — I152 Hypertension secondary to endocrine disorders: Secondary | ICD-10-CM

## 2023-05-25 DIAGNOSIS — Z87891 Personal history of nicotine dependence: Secondary | ICD-10-CM

## 2023-05-25 DIAGNOSIS — E119 Type 2 diabetes mellitus without complications: Secondary | ICD-10-CM

## 2023-05-25 LAB — POCT GLYCOSYLATED HEMOGLOBIN (HGB A1C): Hemoglobin A1C: 5.7 % — AB (ref 4.0–5.6)

## 2023-05-25 MED ORDER — AMPHETAMINE-DEXTROAMPHET ER 30 MG PO CP24: 30.0000 mg | ORAL_CAPSULE | Freq: Two times a day (BID) | ORAL | 0 refills | Status: DC

## 2023-05-25 MED ORDER — AMPHETAMINE-DEXTROAMPHETAMINE 10 MG PO TABS: 10.0000 mg | ORAL_TABLET | Freq: Every day | ORAL | 0 refills | Status: DC | PRN

## 2023-05-25 NOTE — Progress Notes (Signed)
Subjective  CC:  Chief Complaint  Patient presents with   Hypertension   Diabetes   ADHD    HPI: George Campbell is a 52 y.o. male who presents to the office today for follow up of diabetes and problems listed above in the chief complaint.  Diabetes follow up: His diabetic control is reported as Unchanged. Doing great. Diet is a little worse and weight is up. Still lots of walking at work. Got a promotion so that may lessen.  He denies exertional CP or SOB or symptomatic hypoglycemia. He denies foot sores or paresthesias. Imms current. On ace. No statin indicated. Reports had eye exam: needs to get name of doc so we can get records HTN on amlodipine and ace/hydrochlorothiazide and doing well.  ADD: now back on adderall xr 30mg  bid. Works well. Needs bid dosing with work load/needs. Thriving at work. No AE. I reviewed patient's records from the PMP aware controlled substance registry today.  Reviewed neg lung cancer screen. Remains tobacco free and alcohol free.   Wt Readings from Last 3 Encounters:  05/25/23 200 lb 3.2 oz (90.8 kg)  11/05/22 191 lb (86.6 kg)  05/07/22 185 lb (83.9 kg)    BP Readings from Last 3 Encounters:  05/25/23 122/72  11/05/22 110/72  05/07/22 128/84    Assessment  1. Type 2 diabetes mellitus without complication, without long-term current use of insulin (HCC)   2. Attention deficit disorder (ADD) without hyperactivity   3. Hypertension associated with type 2 diabetes mellitus (HCC)   4. Former smoker 30 pak year quit 2017      Plan  Diabetes is currently very well controlled. Diet controlled. To work on diet again.  ADD: refilled adderall xr 30 bid; (added 10mg  IR due to only providing #30 pills last month of the XR by mistake).  Bp is controlled.   Follow up: 6 mo for cpe and f/u Orders Placed This Encounter  Procedures   POCT HgB A1C   Meds ordered this encounter  Medications   amphetamine-dextroamphetamine (ADDERALL) 10 MG tablet    Sig:  Take 1 tablet (10 mg total) by mouth daily as needed.    Dispense:  30 tablet    Refill:  0   amphetamine-dextroamphetamine (ADDERALL XR) 30 MG 24 hr capsule    Sig: Take 1 capsule (30 mg total) by mouth in the morning and at bedtime.    Dispense:  60 capsule    Refill:  0   amphetamine-dextroamphetamine (ADDERALL XR) 30 MG 24 hr capsule    Sig: Take 1 capsule (30 mg total) by mouth in the morning and at bedtime.    Dispense:  60 capsule    Refill:  0   amphetamine-dextroamphetamine (ADDERALL XR) 30 MG 24 hr capsule    Sig: Take 1 capsule (30 mg total) by mouth in the morning and at bedtime.    Dispense:  60 capsule    Refill:  0      Immunization History  Administered Date(s) Administered   Hepatitis B, ADULT 06/21/2018, 07/24/2018   Influenza Inj Mdck Quad Pf 06/10/2017, 06/04/2019   Influenza Nasal 05/15/2013, 05/27/2014   Influenza, Quadrivalent, Recombinant, Inj, Pf 06/10/2017   Influenza, Seasonal, Injecte, Preservative Fre 06/02/2015, 05/31/2016, 05/03/2023   Influenza,inj,Quad PF,6+ Mos 06/02/2015, 05/31/2016, 06/21/2018, 05/02/2020, 05/07/2022   Influenza-Unspecified 06/21/2018, 06/04/2019, 07/14/2021   PFIZER(Purple Top)SARS-COV-2 Vaccination 10/25/2019, 11/22/2019, 05/29/2020, 04/10/2021   Pneumococcal Conjugate-13 08/05/2021   Pneumococcal Polysaccharide-23 09/14/2016   Tdap 09/06/2014, 06/10/2019  Zoster Recombinant(Shingrix) 11/05/2022, 05/03/2023    Diabetes Related Lab Review: Lab Results  Component Value Date   HGBA1C 5.7 (A) 05/25/2023   HGBA1C 5.9 11/05/2022   HGBA1C 5.7 (A) 05/07/2022    Lab Results  Component Value Date   MICROALBUR <0.7 11/05/2022   Lab Results  Component Value Date   CREATININE 0.77 11/05/2022   BUN 11 11/05/2022   NA 138 11/05/2022   K 4.2 11/05/2022   CL 98 11/05/2022   CO2 33 (H) 11/05/2022   Lab Results  Component Value Date   CHOL 108 11/05/2022   CHOL 118 11/03/2021   CHOL 108 10/09/2020   Lab Results   Component Value Date   HDL 49.40 11/05/2022   HDL 54.90 11/03/2021   HDL 31.00 (L) 10/09/2020   Lab Results  Component Value Date   LDLCALC 48 11/05/2022   LDLCALC 53 11/03/2021   LDLCALC 41 10/09/2020   Lab Results  Component Value Date   TRIG 56.0 11/05/2022   TRIG 52.0 11/03/2021   TRIG 179.0 (H) 10/09/2020   Lab Results  Component Value Date   CHOLHDL 2 11/05/2022   CHOLHDL 2 11/03/2021   CHOLHDL 3 10/09/2020   No results found for: "LDLDIRECT" The ASCVD Risk score (Arnett DK, et al., 2019) failed to calculate for the following reasons:   The valid total cholesterol range is 130 to 320 mg/dL I have reviewed the PMH, Fam and Soc history. Patient Active Problem List   Diagnosis Date Noted Date Diagnosed   Former smoker 30 pak year quit 2017 11/05/2022     Priority: High    Started lung cancer screens 2024, age 88    Type 2 diabetes mellitus without complication, without long-term current use of insulin (HCC) 08/05/2021     Priority: High    Started metformin 09/2020; no statin due to excellent LDL < 60 naturally. Stopped met 06/2021, diet controlled    Alcoholism in recovery (HCC) 11/24/2017     Priority: High   Hypertension associated with type 2 diabetes mellitus (HCC) 03/25/2015     Priority: High    On ACE    Attention deficit disorder (ADD) without hyperactivity 04/17/2013     Priority: High    Adderall     Low serum low density lipoprotein (LDL) 05/07/2022     Priority: Medium     Not on statin; LDL in the 30s naturally    Fatty liver, alcoholic 10/05/2015     Priority: Medium     Liver ultrasound 2017, [place].     Social History: Patient  reports that he quit smoking about 7 years ago. His smoking use included cigarettes and e-cigarettes. He started smoking about 37 years ago. He has a 45 pack-year smoking history. He has never used smokeless tobacco. He reports that he does not currently use alcohol. He reports current drug use. Drug:  Marijuana.  Review of Systems: Ophthalmic: negative for eye pain, loss of vision or double vision Cardiovascular: negative for chest pain Respiratory: negative for SOB or persistent cough Gastrointestinal: negative for abdominal pain Genitourinary: negative for dysuria or gross hematuria MSK: negative for foot lesions Neurologic: negative for weakness or gait disturbance  Objective  Vitals: BP 122/72   Pulse 83   Temp 98.2 F (36.8 C)   Ht 5\' 10"  (1.778 m)   Wt 200 lb 3.2 oz (90.8 kg)   SpO2 96%   BMI 28.73 kg/m  General: well appearing, no acute distress  Psych:  Alert  and oriented, normal mood and affect HEENT:  Normocephalic, atraumatic, moist mucous membranes, supple neck  Cardiovascular:  Nl S1 and S2, RRR without murmur, gallop or rub. no edema Respiratory:  Good breath sounds bilaterally, CTAB with normal effort, no rales  Diabetic education: ongoing education regarding chronic disease management for diabetes was given today. We continue to reinforce the ABC's of diabetic management: A1c (<7 or 8 dependent upon patient), tight blood pressure control, and cholesterol management with goal LDL < 100 minimally. We discuss diet strategies, exercise recommendations, medication options and possible side effects. At each visit, we review recommended immunizations and preventive care recommendations for diabetics and stress that good diabetic control can prevent other problems. See below for this patient's data.   Commons side effects, risks, benefits, and alternatives for medications and treatment plan prescribed today were discussed, and the patient expressed understanding of the given instructions. Patient is instructed to call or message via MyChart if he/she has any questions or concerns regarding our treatment plan. No barriers to understanding were identified. We discussed Red Flag symptoms and signs in detail. Patient expressed understanding regarding what to do in case of urgent  or emergency type symptoms.  Medication list was reconciled, printed and provided to the patient in AVS. Patient instructions and summary information was reviewed with the patient as documented in the AVS. This note was prepared with assistance of Dragon voice recognition software. Occasional wrong-word or sound-a-like substitutions may have occurred due to the inherent limitations of voice recognition software

## 2023-06-25 ENCOUNTER — Encounter: Payer: Self-pay | Admitting: Family Medicine

## 2023-06-28 MED ORDER — AMPHETAMINE-DEXTROAMPHETAMINE 20 MG PO TABS
10.0000 mg | ORAL_TABLET | Freq: Every day | ORAL | 0 refills | Status: DC
Start: 2023-06-28 — End: 2023-07-15

## 2023-06-28 MED ORDER — AMPHETAMINE-DEXTROAMPHET ER 20 MG PO CP24: 20.0000 mg | ORAL_CAPSULE | Freq: Two times a day (BID) | ORAL | 0 refills | Status: DC

## 2023-06-28 MED ORDER — AMPHETAMINE-DEXTROAMPHET ER 20 MG PO CP24: 20.0000 mg | ORAL_CAPSULE | Freq: Every day | ORAL | 0 refills | Status: DC

## 2023-06-28 NOTE — Addendum Note (Signed)
Addended by: Asencion Partridge on: 06/28/2023 04:21 PM   Modules accepted: Orders

## 2023-07-15 MED ORDER — AMPHETAMINE-DEXTROAMPHETAMINE 20 MG PO TABS: ORAL_TABLET | ORAL | 0 refills | Status: DC

## 2023-07-15 NOTE — Telephone Encounter (Signed)
Dr. Mardelle Matte can you please send in correct dose for Adderall 20 mg tablets, pt takes one tablet in AM and one in the afternoon, needs 60 tablets not 30. I have corrected Rx for you just need to send. Thanks

## 2023-07-15 NOTE — Addendum Note (Signed)
Addended by: Asencion Partridge on: 07/15/2023 11:35 AM   Modules accepted: Orders

## 2023-07-15 NOTE — Addendum Note (Signed)
Addended by: Jimmye Norman on: 07/15/2023 09:03 AM   Modules accepted: Orders

## 2023-08-16 ENCOUNTER — Other Ambulatory Visit: Payer: Self-pay | Admitting: Family Medicine

## 2023-08-16 MED ORDER — AMPHETAMINE-DEXTROAMPHETAMINE 20 MG PO TABS: ORAL_TABLET | ORAL | 0 refills | Status: DC

## 2023-09-02 IMAGING — DX DG SHOULDER 2+V*R*
3 series · 3 of 3 positions shown · non-contrast
Comparison: None

CLINICAL DATA: Chronic right shoulder pain worse over the past 3-5
days.

EXAM:
RIGHT SHOULDER - 2+ VIEW

[shoulder ap (1 of 2)]
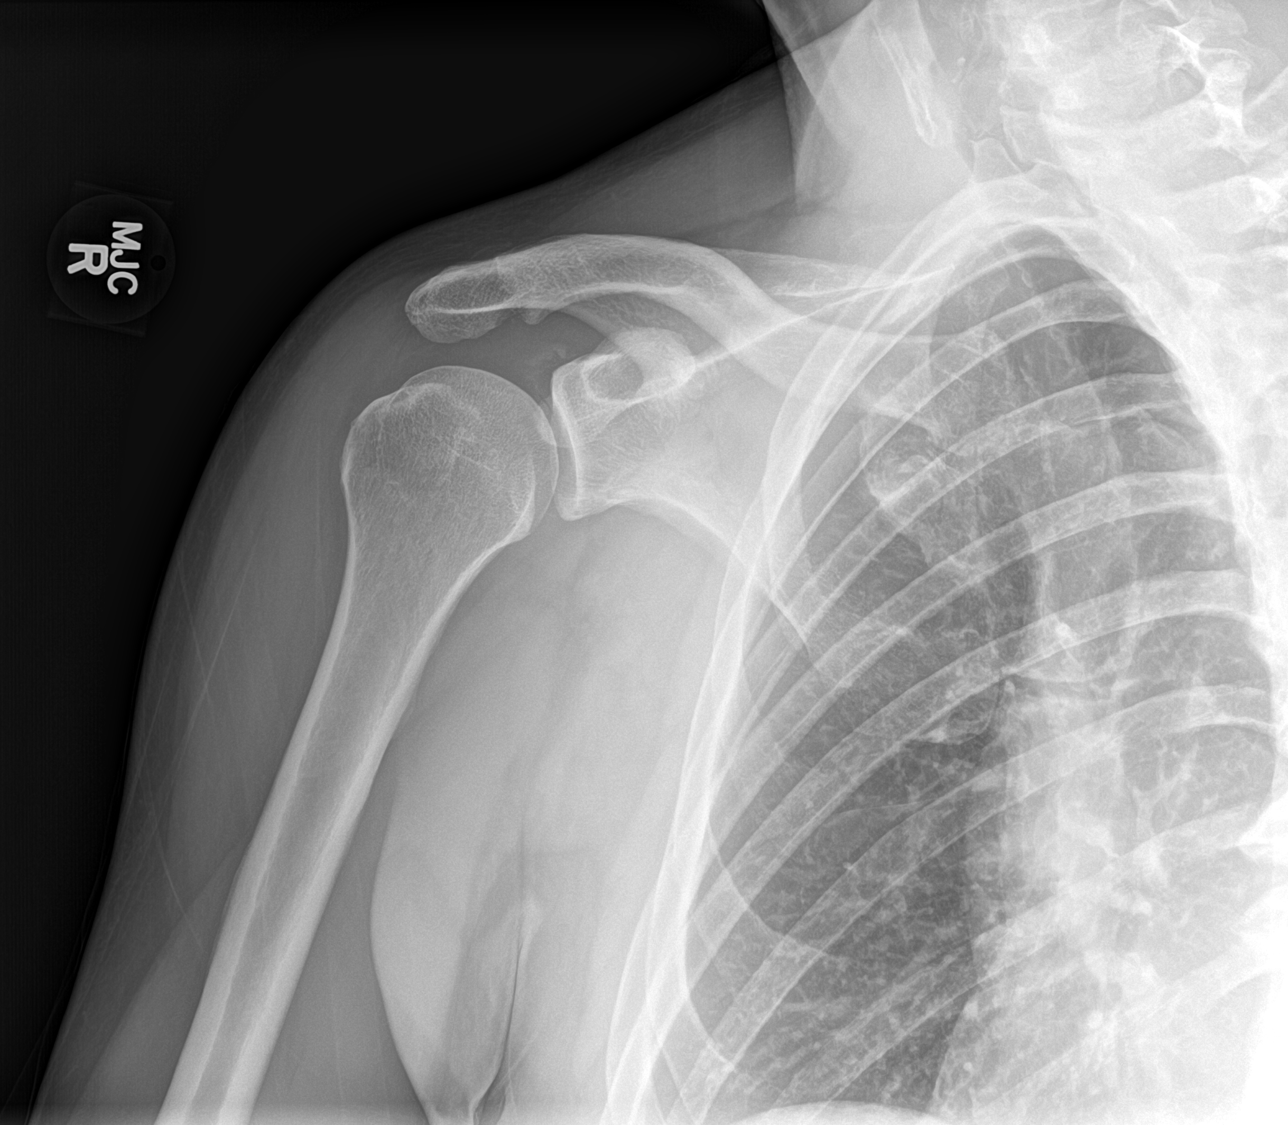

[shoulder ap (2 of 2)]
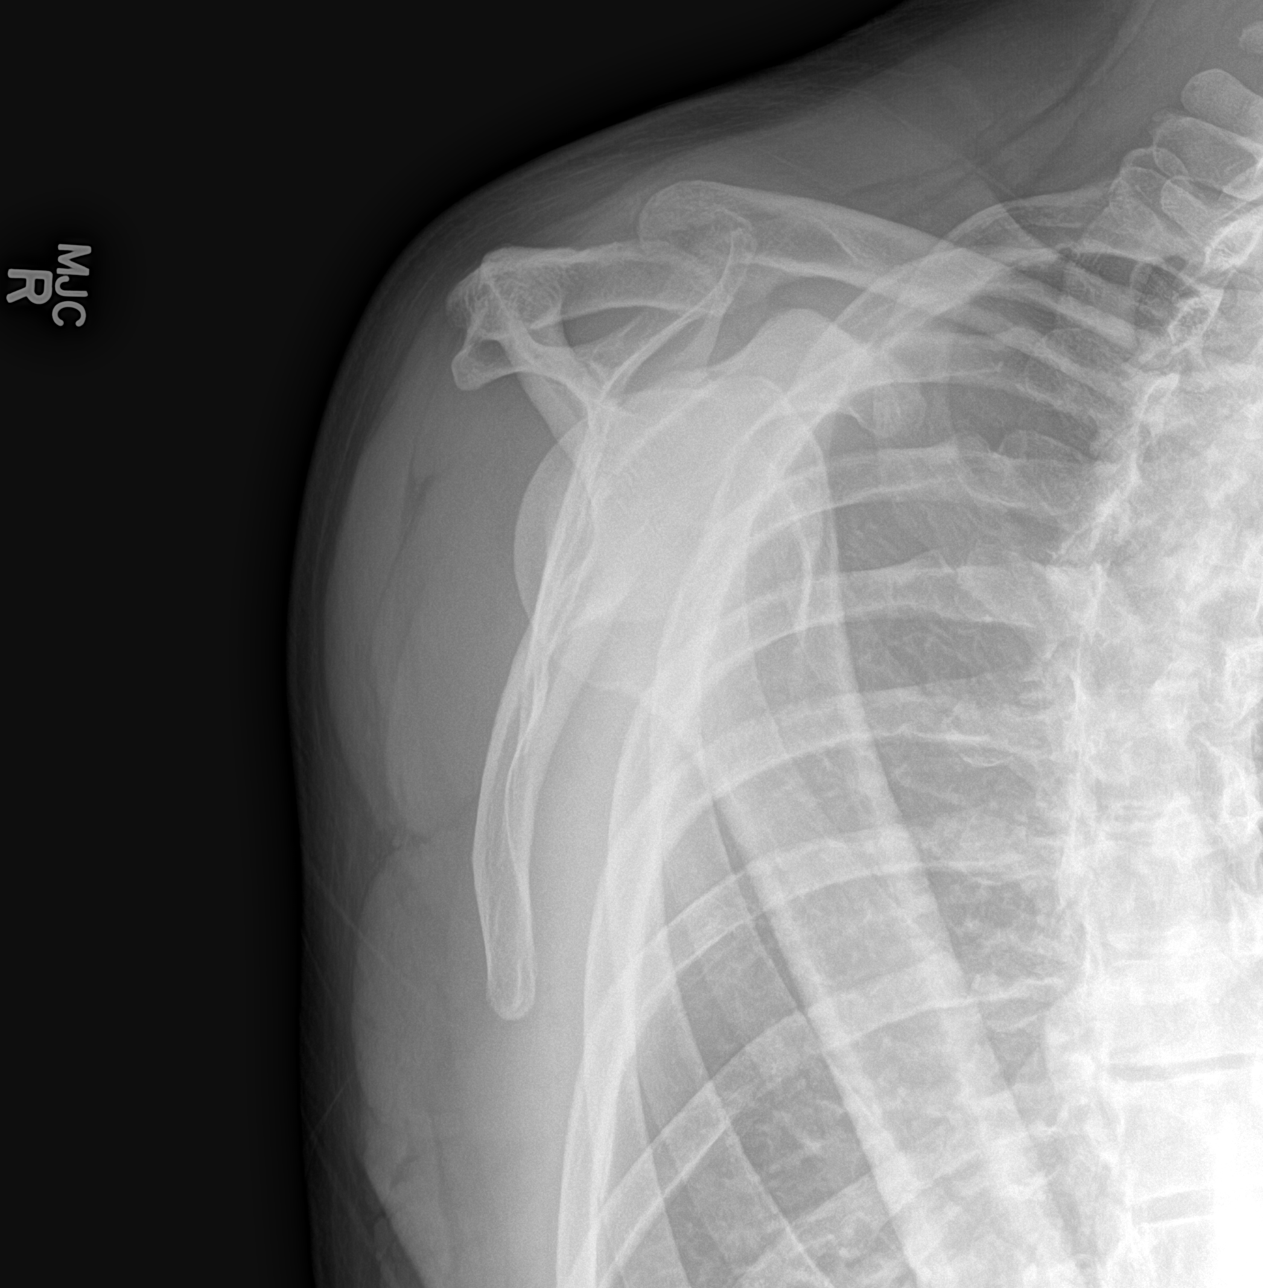

[shoulder axial]
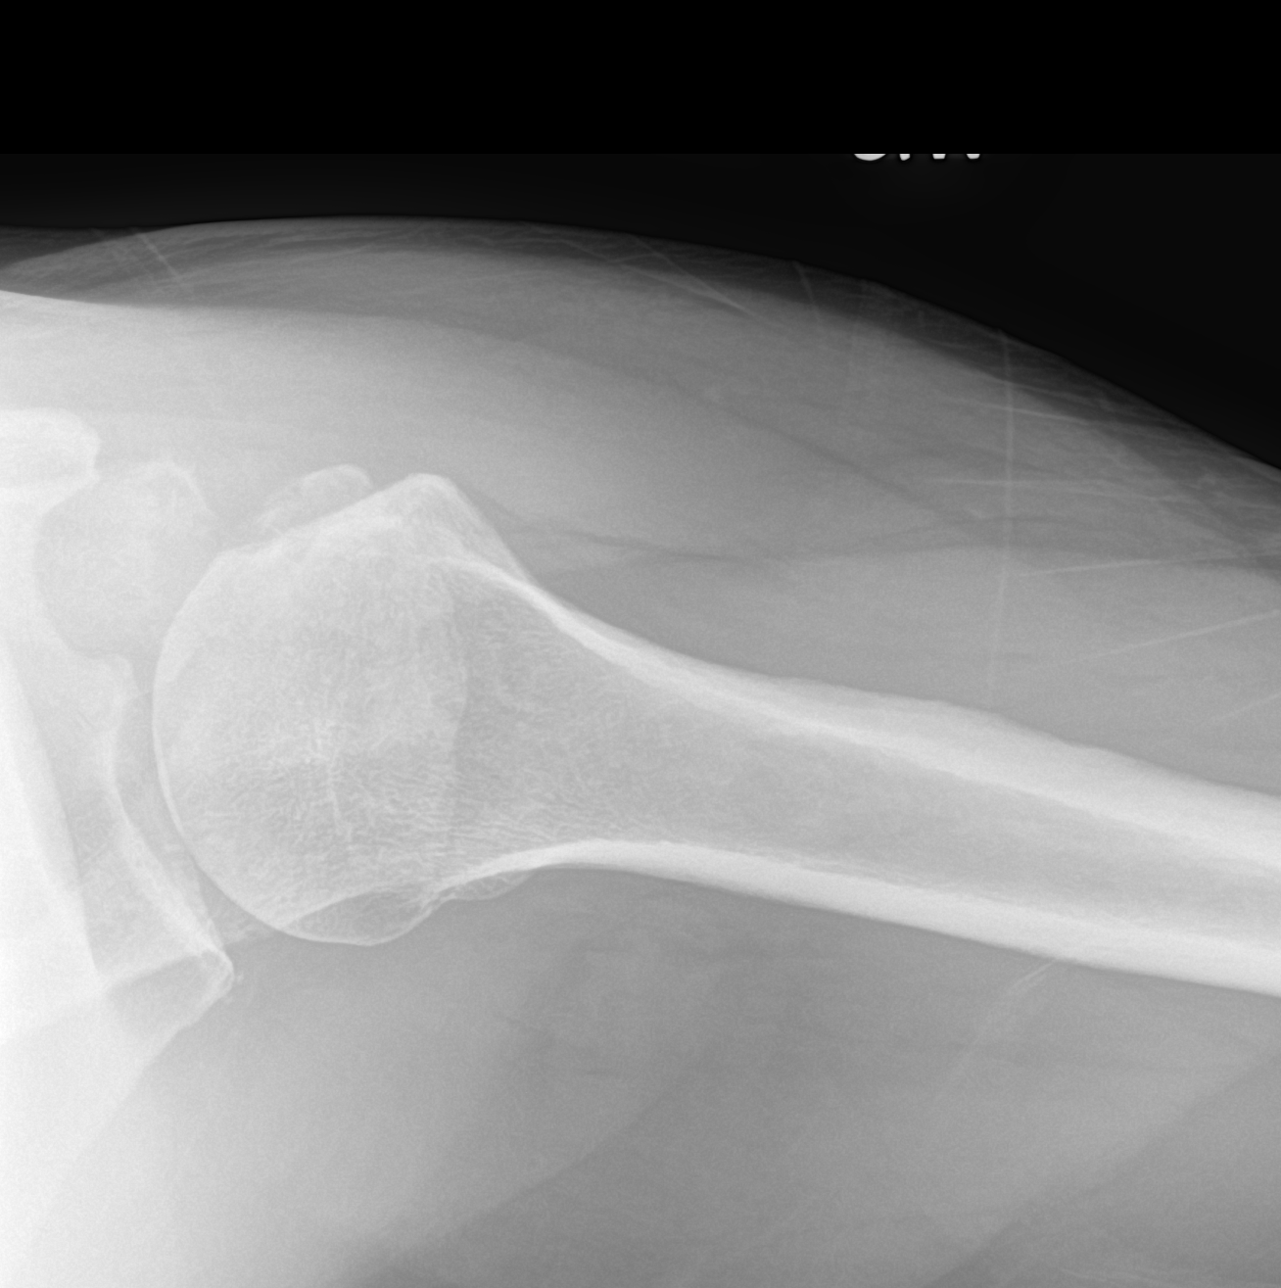

[3 of 3 positions shown; findings below may reference images not displayed]

FINDINGS: Minimal acromioclavicular peripheral osteophytosis. The glenohumeral
joint space is maintained. No acute fracture or dislocation. The
visualized portion of the right lung is unremarkable.
IMPRESSION: Minimal acromioclavicular osteoarthritis.

## 2023-09-23 ENCOUNTER — Other Ambulatory Visit: Payer: Self-pay | Admitting: Family Medicine

## 2023-09-23 NOTE — Telephone Encounter (Signed)
Last OV: 05/25/23  Next OV: 11/24/23  Last Filled: 08/03/23  Quantity: 60

## 2023-09-24 MED ORDER — AMPHETAMINE-DEXTROAMPHET ER 20 MG PO CP24: 20.0000 mg | ORAL_CAPSULE | Freq: Two times a day (BID) | ORAL | 0 refills | Status: DC

## 2023-10-21 ENCOUNTER — Other Ambulatory Visit: Payer: Self-pay | Admitting: Family Medicine

## 2023-10-21 NOTE — Telephone Encounter (Signed)
Pt requesting refill for Adderall XR 30 mg. Last OV 05/2023.

## 2023-10-23 MED ORDER — AMPHETAMINE-DEXTROAMPHET ER 30 MG PO CP24: 30.0000 mg | ORAL_CAPSULE | Freq: Two times a day (BID) | ORAL | 0 refills | Status: DC

## 2023-11-24 ENCOUNTER — Ambulatory Visit: Payer: BC Managed Care – PPO | Admitting: Family Medicine

## 2023-11-24 ENCOUNTER — Encounter: Payer: Self-pay | Admitting: Family Medicine

## 2023-11-24 VITALS — BP 118/78 | HR 78 | Temp 97.9°F | Ht 70.0 in | Wt 195.0 lb

## 2023-11-24 DIAGNOSIS — Z1211 Encounter for screening for malignant neoplasm of colon: Secondary | ICD-10-CM

## 2023-11-24 DIAGNOSIS — K700 Alcoholic fatty liver: Secondary | ICD-10-CM | POA: Diagnosis not present

## 2023-11-24 DIAGNOSIS — E1159 Type 2 diabetes mellitus with other circulatory complications: Secondary | ICD-10-CM | POA: Diagnosis not present

## 2023-11-24 DIAGNOSIS — E119 Type 2 diabetes mellitus without complications: Secondary | ICD-10-CM

## 2023-11-24 DIAGNOSIS — Z0000 Encounter for general adult medical examination without abnormal findings: Secondary | ICD-10-CM

## 2023-11-24 DIAGNOSIS — F988 Other specified behavioral and emotional disorders with onset usually occurring in childhood and adolescence: Secondary | ICD-10-CM

## 2023-11-24 DIAGNOSIS — I152 Hypertension secondary to endocrine disorders: Secondary | ICD-10-CM

## 2023-11-24 DIAGNOSIS — Z0001 Encounter for general adult medical examination with abnormal findings: Secondary | ICD-10-CM

## 2023-11-24 DIAGNOSIS — L84 Corns and callosities: Secondary | ICD-10-CM

## 2023-11-24 DIAGNOSIS — R7989 Other specified abnormal findings of blood chemistry: Secondary | ICD-10-CM | POA: Diagnosis not present

## 2023-11-24 LAB — CBC WITH DIFFERENTIAL/PLATELET
Basophils Absolute: 0 10*3/uL (ref 0.0–0.1)
Basophils Relative: 0.4 % (ref 0.0–3.0)
Eosinophils Absolute: 0.1 10*3/uL (ref 0.0–0.7)
Eosinophils Relative: 0.9 % (ref 0.0–5.0)
HCT: 41.1 % (ref 39.0–52.0)
Hemoglobin: 14.2 g/dL (ref 13.0–17.0)
Lymphocytes Relative: 16.7 % (ref 12.0–46.0)
Lymphs Abs: 1.7 10*3/uL (ref 0.7–4.0)
MCHC: 34.6 g/dL (ref 30.0–36.0)
MCV: 84.7 fl (ref 78.0–100.0)
Monocytes Absolute: 1.1 10*3/uL — ABNORMAL HIGH (ref 0.1–1.0)
Monocytes Relative: 11 % (ref 3.0–12.0)
Neutro Abs: 7.1 10*3/uL (ref 1.4–7.7)
Neutrophils Relative %: 71 % (ref 43.0–77.0)
Platelets: 345 10*3/uL (ref 150.0–400.0)
RBC: 4.86 Mil/uL (ref 4.22–5.81)
RDW: 14.2 % (ref 11.5–15.5)
WBC: 10 10*3/uL (ref 4.0–10.5)

## 2023-11-24 LAB — COMPREHENSIVE METABOLIC PANEL WITH GFR
ALT: 12 U/L (ref 0–53)
AST: 15 U/L (ref 0–37)
Albumin: 4.6 g/dL (ref 3.5–5.2)
Alkaline Phosphatase: 63 U/L (ref 39–117)
BUN: 12 mg/dL (ref 6–23)
CO2: 32 meq/L (ref 19–32)
Calcium: 9.6 mg/dL (ref 8.4–10.5)
Chloride: 98 meq/L (ref 96–112)
Creatinine, Ser: 0.7 mg/dL (ref 0.40–1.50)
GFR: 107.01 mL/min (ref >60.00)
Glucose, Bld: 99 mg/dL (ref 70–99)
Potassium: 3.5 meq/L (ref 3.5–5.1)
Sodium: 137 meq/L (ref 135–145)
Total Bilirubin: 0.7 mg/dL (ref 0.2–1.2)
Total Protein: 7.7 g/dL (ref 6.0–8.3)

## 2023-11-24 LAB — LIPID PANEL
Cholesterol: 100 mg/dL (ref 0–200)
HDL: 47.7 mg/dL (ref >39.00)
LDL Cholesterol: 42 mg/dL (ref 0–99)
NonHDL: 52.05
Total CHOL/HDL Ratio: 2
Triglycerides: 51 mg/dL (ref 0.0–149.0)
VLDL: 10.2 mg/dL (ref 0.0–40.0)

## 2023-11-24 LAB — HEMOGLOBIN A1C: Hgb A1c MFr Bld: 6.1 % (ref 4.6–6.5)

## 2023-11-24 LAB — MICROALBUMIN / CREATININE URINE RATIO
Creatinine,U: 88.7 mg/dL
Microalb Creat Ratio: UNDETERMINED mg/g (ref 0.0–30.0)
Microalb, Ur: <0.7 mg/dL

## 2023-11-24 MED ORDER — LISINOPRIL-HYDROCHLOROTHIAZIDE 20-12.5 MG PO TABS: 2.0000 | ORAL_TABLET | Freq: Every day | ORAL | 3 refills | Status: AC

## 2023-11-24 MED ORDER — AMLODIPINE BESYLATE 10 MG PO TABS: 10.0000 mg | ORAL_TABLET | Freq: Every day | ORAL | 3 refills | Status: AC

## 2023-11-24 NOTE — Progress Notes (Signed)
 Subjective  Chief Complaint  Patient presents with   Annual Exam   Diabetes   Hypertension    HPI: George Campbell is a 53 y.o. male who presents to [facility] [place] today for a Male Wellness Visit. He also has the concerns and/or needs as listed above in the chief complaint. These will be addressed in addition to the Health Maintenance Visit.   Wellness Visit: annual visit with health maintenance review and exam   HM: had negative cologuard 08.2024; open to colonoscopy for screening now. Annual lung cancer screenign CT due in July: empheyematous changes discussed. Eye exam current w/o retinopathy. Imms up to date. Feels great. Working long hours and [employer] well.   Body mass index is 27.98 kg/m. Wt Readings from Last 3 Encounters:  11/24/23 195 lb (88.5 kg)  05/25/23 200 lb 3.2 oz (90.8 kg)  11/05/22 191 lb (86.6 kg)    Chronic disease management visit and/or acute problem visit: Discussed the use of AI scribe software for clinical note transcription with the patient, who gave verbal consent to proceed.  History of Present Illness Discussed the use of AI scribe software for clinical note transcription with the patient, who gave verbal consent to proceed.  History of Present Illness   George Campbell "George Campbell" is a 53 year old male who presents with foot pain due to a persistent callus and f/u diabetes/HTN and ADD.  He has a persistent callus on his foot, described as a 'bulb', which has been present for a long time. The callus recurs despite management attempts with pedicures and home care, including self-scraping. Salicylic acid pads have been ineffective. He walks an average of 30,000 steps a day, which may contribute to the issue.  He has a history of diabetes, which has been under control with normal readings for the past couple of years. He is mindful of his diet, particularly sweets, and has made significant lifestyle changes to manage his condition. However,  his new work schedule has made it challenging to maintain previous eating habits, leading to more 'junky food' consumption.no comlications  He is currently taking Adderall, preferring 30 mg XR once a day and 20 mg tablets, although he experiences difficulty obtaining the exact formulation due to pharmacy supply issues. He has been on Adderall for 30 years and is considering mail order options to improve availability.  He has a history of alcohol use disorder but has been sober for nearly five years. He reflects on the challenges of addiction and the importance of personal commitment to recovery. He no longer attends AA meetings but remains aware of the tools available to prevent relapse.   Fatty liver: continues to try to eat low fat. Weight is stable. No ruq pain.       Assessment  1. Encounter for well adult exam with abnormal findings   2. Attention deficit disorder (ADD) without hyperactivity   3. Hypertension associated with type 2 diabetes mellitus (HCC)   4. Type 2 diabetes mellitus without complication, without long-term current use of insulin (HCC)   5. Fatty liver, alcoholic   6. Low serum low density lipoprotein (LDL)   7. Screening for colorectal cancer   8. Callus of foot      Plan  Male Wellness Visit: Age appropriate Health Maintenance and Prevention measures were discussed with patient. Included topics are cancer screening recommendations, ways to keep healthy (see AVS) including dietary and exercise recommendations, regular eye and dental care, use of  seat belts, and avoidance of moderate alcohol use and tobacco use. Refer for colonoscopy.  BMI: discussed patient's BMI and encouraged positive lifestyle modifications to help get to or maintain a target BMI. HM needs and immunizations were addressed and ordered. See below for orders. See HM and immunization section for updates. Routine labs and screening tests ordered including cmp, cbc and lipids where  appropriate. Discussed recommendations regarding Vit D and calcium supplementation (see AVS)  Chronic disease f/u and/or acute problem visit: (deemed necessary to be done in addition to the wellness visit): Assessment and Plan    Foot Callus Chronic callus due to reduced fat pad and mechanical trauma. Current management ineffective. - Refer to podiatrist for foot care in [facility]. - Recommend metatarsal pad in shoes. - Advise wearing firmer supportive shoes with wide toe box, e.g., Nike Metcon.  Hypertension Blood pressure well-controlled with current medication. - Continue current antihypertensive medications. Check renal function and lytes  ADHD Challenges with pharmacy availability of Adderall XR. Mail order pharmacy may offer better availability and 90-day supply. - Message for prescription refills as needed. - Consider mail order pharmacy for better availability of Adderall.  Recheck fatty liver/lfts.   General Health Maintenance Due for colon and lung cancer screenings. Prefers colonoscopy. Immunizations up to date. Strong reaction to shingles vaccine but understands importance. - Order referral for colonoscopy. august - Schedule lung cancer screening test. july - Ensure eye exam is scheduled.  Follow-up Routine follow-up and monitoring of health conditions. - Order lab work including blood and urine tests. - Schedule follow-up appointment in six months.   Commons side effects, risks, benefits, and alternatives for medications and treatment plan prescribed today were discussed, and the patient expressed understanding of the given instructions. Patient is instructed to call or message via MyChart if he/she has any questions or concerns regarding our treatment plan. No barriers to understanding were identified. We discussed Red Flag symptoms and signs in detail. Patient expressed understanding regarding what to do in case of urgent or emergency type symptoms.  Medication list  was reconciled, printed and provided to the patient in AVS. Patient instructions and summary information was reviewed with the patient as documented in the AVS. This note was prepared with assistance of Dragon voice recognition software. Occasional wrong-word or sound-a-like substitutions may have occurred due to the inherent limitations of voice recognition software  Orders Placed This Encounter  Procedures   CBC with Differential/Platelet   Comprehensive metabolic panel with GFR   Lipid panel   Hemoglobin A1c   TSH   Microalbumin / creatinine urine ratio   Ambulatory referral to Gastroenterology   Ambulatory referral to Podiatry   Meds ordered this encounter  Medications   lisinopril-hydrochlorothiazide (ZESTORETIC) 20-12.5 MG tablet    Sig: Take 2 tablets by mouth daily.    Dispense:  180 tablet    Refill:  3   amLODipine (NORVASC) 10 MG tablet    Sig: Take 1 tablet (10 mg total) by mouth daily.    Dispense:  90 tablet    Refill:  3     Patient Active Problem List   Diagnosis Date Noted   Former smoker 30 pak year quit 2017 11/05/2022   Type 2 diabetes mellitus without complication, without long-term current use of insulin (HCC) 08/05/2021   Alcoholism in recovery (HCC) 11/24/2017   Hypertension associated with type 2 diabetes mellitus (HCC) 03/25/2015   Attention deficit disorder (ADD) without hyperactivity 04/17/2013   Low serum low density lipoprotein (  LDL) 05/07/2022   Fatty liver, alcoholic 10/05/2015   Health Maintenance  Topic Date Due   Diabetic kidney evaluation - eGFR measurement  11/05/2023   Diabetic kidney evaluation - Urine ACR  11/05/2023   HEMOGLOBIN A1C  11/22/2023   COVID-19 Vaccine (5 - 2024-25 season) 12/10/2023 (Originally 05/01/2023)   OPHTHALMOLOGY EXAM  02/28/2024   Lung Cancer Screening  03/01/2024   Fecal DNA (Cologuard)  04/21/2024   FOOT EXAM  11/23/2024   DTaP/Tdap/Td (3 - Td or Tdap) 06/09/2029   Pneumococcal Vaccine 10-56 Years old (3 of  3 - PPSV23 or PCV20) 10/26/2037   INFLUENZA VACCINE  Completed   Hepatitis C Screening  Completed   HIV Screening  Completed   Zoster Vaccines- Shingrix  Completed   HPV VACCINES  Aged Out   Immunization History  Administered Date(s) Administered   Hepatitis B, ADULT 06/21/2018, 07/24/2018   Influenza Inj Mdck Quad Pf 06/10/2017, 06/04/2019   Influenza Nasal 05/15/2013, 05/27/2014   Influenza, Quadrivalent, Recombinant, Inj, Pf 06/10/2017   Influenza, Seasonal, Injecte, Preservative Fre 06/02/2015, 05/31/2016, 05/03/2023   Influenza,inj,Quad PF,6+ Mos 06/02/2015, 05/31/2016, 06/21/2018, 05/02/2020, 05/07/2022   Influenza-Unspecified 06/21/2018, 06/04/2019, 07/14/2021   PFIZER(Purple Top)SARS-COV-2 Vaccination 10/25/2019, 11/22/2019, 05/29/2020, 04/10/2021   Pneumococcal Conjugate-13 08/05/2021   Pneumococcal Polysaccharide-23 09/14/2016   Tdap 09/06/2014, 06/10/2019   Zoster Recombinant(Shingrix) 11/05/2022, 05/03/2023   We updated and reviewed the patient's past history in detail and it is documented below. Allergies: Patient is allergic to strattera [atomoxetine hcl]. Past Medical History  has a past medical history of Anxiety, Depression, Hypertension, Inguinal hernia, and Substance abuse (HCC) (02 2019). Past Surgical History Patient  has a past surgical history that includes Knee arthrodesis (Right, 2002); Mouth surgery; Hernia repair; and XI Robotic assisted inguinal hernia repair with mesh (Bilateral, 09/25/2019). Social History Patient  reports that he quit smoking about 7 years ago. His smoking use included cigarettes and e-cigarettes. He started smoking about 38 years ago. He has a 45 pack-year smoking history. He has never used smokeless tobacco. He reports that he does not currently use alcohol. He reports current drug use. Drug: Marijuana. Family History family history includes ADD / ADHD in his mother; Alcohol abuse in his father; Esophageal cancer in his maternal uncle;  Healthy in his paternal uncle and son; Hypertension in his mother. Review of Systems: Constitutional: negative for fever or malaise Ophthalmic: negative for photophobia, double vision or loss of vision Cardiovascular: negative for chest pain, dyspnea on exertion, or new LE swelling Respiratory: negative for SOB or persistent cough Gastrointestinal: negative for abdominal pain, change in bowel habits or melena Genitourinary: negative for dysuria or gross hematuria Musculoskeletal: negative for new gait disturbance or muscular weakness Integumentary: negative for new or persistent rashes Neurological: negative for TIA or stroke symptoms Psychiatric: negative for SI or delusions Allergic/Immunologic: negative for hives  Patient Care Team    Relationship Specialty Notifications Start End  Willow Ora, MD PCP - General Family Medicine  09/26/17   Berna Bue, MD Consulting Physician General Surgery  09/26/19    Objective  Vitals: BP 118/78   Pulse 78   Temp 97.9 F (36.6 C)   Ht 5\' 10"  (1.778 m)   Wt 195 lb (88.5 kg)   SpO2 100%   BMI 27.98 kg/m  General:  Well developed, well nourished, no acute distress  Psych:  Alert and orientedx3,normal mood and affect HEENT:  Normocephalic, atraumatic, non-icteric sclera,  oropharynx is clear without mass or exudate, supple neck  without adenopathy, or thyromegaly Cardiovascular:  Normal S1, S2, RRR without gallop, rub or murmur,  Respiratory:  Good breath sounds bilaterally, CTAB with normal respiratory effort Gastrointestinal: normal bowel sounds, soft, non-tender, no noted masses. No HSM MSK: Joints are without erythema or swelling.  Skin:  Warm, no rashes Neurologic:    Mental status is normal.  Gross motor and sensory exams are normal. Stable gait. No tremor  Diabetic Foot Exam: Appearance - right foot: base of 3rd metatarsal head: soft callus Skin - no sigificant pallor or erythema Normal sensation Pulses - +2 distally  bilaterally

## 2023-11-24 NOTE — Patient Instructions (Signed)
 Please return in 6 months for hypertension and ADD follow up.   I will release your lab results to you on your MyChart account with further instructions. You may see the results before I do, but when I review them I will send you a message with my report or have my assistant call you if things need to be discussed. Please reply to my message with any questions. Thank you!   If you have any questions or concerns, please don't hesitate to send me a message via MyChart or call the office at [phone]. Thank you for visiting with [place] today! It's our pleasure caring for you.   VISIT SUMMARY:  Today, we discussed your persistent foot callus, hypertension, ADHD medication challenges, and general health maintenance. We reviewed your current treatments and made some adjustments to better manage your conditions.  YOUR PLAN:  -FOOT CALLUS: A callus is a thickened and hardened part of the skin that forms due to repeated pressure or friction. Your callus is likely due to reduced fat padding and mechanical trauma from walking. We recommend seeing a podiatrist in [facility] for specialized foot care, using a metatarsal pad in your shoes, and wearing firmer supportive shoes with a wide toe box, such as Nike Metcon.  -HYPERTENSION: Hypertension, or high blood pressure, is a condition where the force of the blood against your artery walls is too high. Your blood pressure is well-controlled with your current medication, so we will continue with the same treatment.  -ADHD: Attention Deficit Hyperactivity Disorder (ADHD) is a condition characterized by symptoms of inattention, hyperactivity, and impulsivity. You have been experiencing challenges with the availability of your Adderall XR medication. We suggest considering a mail order pharmacy for better availability and a 90-day supply. Please message [place] for prescription refills as needed.  -GENERAL HEALTH MAINTENANCE: General health maintenance includes routine  screenings and tests to ensure overall well-being. You are due for colon and lung cancer screenings, and you prefer a colonoscopy. Your immunizations are up to date, although you had a strong reaction to the shingles vaccine. We will order a referral for a colonoscopy, schedule a lung cancer screening test, and ensure your eye exam is scheduled.  INSTRUCTIONS:  Please follow up with the podiatrist in [facility] for your foot callus. We will order lab work including blood and urine tests, and schedule a follow-up appointment in six months. Additionally, we will arrange for your colonoscopy and lung cancer screening tests.

## 2023-11-25 LAB — TSH: TSH: 1.6 u[IU]/mL (ref 0.35–5.50)

## 2023-11-28 ENCOUNTER — Encounter: Payer: Self-pay | Admitting: Family Medicine

## 2023-11-28 NOTE — Progress Notes (Signed)
 See mychart note Dear Mr. Decarlo, Your lab results look good! Everything is normal; your A1c is 6.1 showing your remain in the diet controlled or prediabetic range. No medications changes are needed at this time.  Take care.  Sincerely, Dr. Mardelle Matte

## 2023-12-05 ENCOUNTER — Encounter: Payer: Self-pay | Admitting: Family Medicine

## 2023-12-05 MED ORDER — AMPHETAMINE-DEXTROAMPHETAMINE 20 MG PO TABS: 20.0000 mg | ORAL_TABLET | Freq: Every day | ORAL | 0 refills | Status: DC

## 2023-12-05 MED ORDER — AMPHETAMINE-DEXTROAMPHET ER 30 MG PO CP24: 30.0000 mg | ORAL_CAPSULE | ORAL | 0 refills | Status: DC

## 2023-12-23 ENCOUNTER — Other Ambulatory Visit: Payer: Self-pay | Admitting: Family Medicine

## 2023-12-23 MED ORDER — AMPHETAMINE-DEXTROAMPHET ER 30 MG PO CP24: 30.0000 mg | ORAL_CAPSULE | ORAL | 0 refills | Status: DC

## 2023-12-23 MED ORDER — AMPHETAMINE-DEXTROAMPHETAMINE 20 MG PO TABS: 20.0000 mg | ORAL_TABLET | Freq: Every day | ORAL | 0 refills | Status: DC

## 2023-12-23 NOTE — Telephone Encounter (Signed)
 11/24/2023 lov  12/05/2023 fill date  30/0 refills

## 2024-01-02 MED ORDER — AMPHETAMINE-DEXTROAMPHET ER 20 MG PO CP24: ORAL_CAPSULE | ORAL | 0 refills | Status: DC

## 2024-01-02 NOTE — Addendum Note (Signed)
 Addended by: Karma Oz on: 01/02/2024 04:50 PM   Modules accepted: Orders

## 2024-01-31 ENCOUNTER — Encounter: Payer: Self-pay | Admitting: Family Medicine

## 2024-01-31 MED ORDER — AMPHETAMINE-DEXTROAMPHET ER 20 MG PO CP24: ORAL_CAPSULE | ORAL | 0 refills | Status: DC

## 2024-02-26 ENCOUNTER — Encounter: Payer: Self-pay | Admitting: Family Medicine

## 2024-02-27 ENCOUNTER — Other Ambulatory Visit: Payer: Self-pay

## 2024-02-27 DIAGNOSIS — F988 Other specified behavioral and emotional disorders with onset usually occurring in childhood and adolescence: Secondary | ICD-10-CM

## 2024-02-27 NOTE — Telephone Encounter (Signed)
 11/24/2023 lov  01/31/2024 fill date  60/0 refills

## 2024-02-28 NOTE — Telephone Encounter (Signed)
 11/24/2023 lov  01/31/2024  60/0 refills

## 2024-04-24 ENCOUNTER — Encounter: Payer: Self-pay | Admitting: Family Medicine

## 2024-04-24 MED ORDER — AMPHETAMINE-DEXTROAMPHET ER 20 MG PO CP24: ORAL_CAPSULE | ORAL | 0 refills | Status: DC

## 2024-05-31 ENCOUNTER — Encounter: Payer: Self-pay | Admitting: Family Medicine

## 2024-05-31 ENCOUNTER — Ambulatory Visit: Admitting: Family Medicine

## 2024-05-31 VITALS — BP 130/86 | HR 66 | Temp 97.7°F | Ht 70.0 in | Wt 205.8 lb

## 2024-05-31 DIAGNOSIS — E119 Type 2 diabetes mellitus without complications: Secondary | ICD-10-CM

## 2024-05-31 DIAGNOSIS — J449 Chronic obstructive pulmonary disease, unspecified: Secondary | ICD-10-CM | POA: Insufficient documentation

## 2024-05-31 DIAGNOSIS — I700 Atherosclerosis of aorta: Secondary | ICD-10-CM | POA: Diagnosis not present

## 2024-05-31 DIAGNOSIS — J439 Emphysema, unspecified: Secondary | ICD-10-CM | POA: Diagnosis not present

## 2024-05-31 DIAGNOSIS — E1159 Type 2 diabetes mellitus with other circulatory complications: Secondary | ICD-10-CM | POA: Diagnosis not present

## 2024-05-31 DIAGNOSIS — Z1211 Encounter for screening for malignant neoplasm of colon: Secondary | ICD-10-CM

## 2024-05-31 DIAGNOSIS — I152 Hypertension secondary to endocrine disorders: Secondary | ICD-10-CM

## 2024-05-31 DIAGNOSIS — F988 Other specified behavioral and emotional disorders with onset usually occurring in childhood and adolescence: Secondary | ICD-10-CM | POA: Diagnosis not present

## 2024-05-31 DIAGNOSIS — Z23 Encounter for immunization: Secondary | ICD-10-CM | POA: Diagnosis not present

## 2024-05-31 LAB — POCT GLYCOSYLATED HEMOGLOBIN (HGB A1C): Hemoglobin A1C: 5.5 % (ref 4.0–5.6)

## 2024-05-31 NOTE — Progress Notes (Signed)
 SABRA

## 2024-05-31 NOTE — Patient Instructions (Signed)
 Please return in 6 months for your annual complete physical; please come fasting.  For follow up on chronic medical conditions   If you have any questions or concerns, please don't hesitate to send me a message via MyChart or call the office at [phone]. Thank you for visiting with us  today! It's our pleasure caring for you.    VISIT SUMMARY: Today, we discussed your recent weight gain, glycemic control, alcohol use disorder, ADHD, hypertension, COPD, and general health maintenance. We reviewed your medications and made some adjustments to better suit your needs.  YOUR PLAN: -ATTENTION-DEFICIT HYPERACTIVITY DISORDER (ADHD): ADHD is a condition that affects focus, self-control, and other important skills. We will continue with your current Adderall XR 20 mg in the morning and at nightfor this month, but next month check to see if the IR 20 is available and message me if you'd like to change back.   -ESSENTIAL HYPERTENSION: Hypertension is high blood pressure, which can lead to serious health problems if not managed. We have refilled your blood pressure medication to ensure you continue your treatment without interruption.  -CHRONIC OBSTRUCTIVE PULMONARY DISEASE (COPD): COPD is a chronic lung condition that makes it hard to breathe. Although you are currently asymptomatic, we will schedule a lung cancer screening to monitor your condition.  -GENERAL HEALTH MAINTENANCE: We discussed the importance of staying up to date with vaccinations. Please ensure your flu vaccination is current and obtain a COVID vaccination.  INSTRUCTIONS: Please schedule a lung cancer screening and I have sent in the cologuard order for colorectal cancer screening again (it is now due)  and ensure you receive your flu and COVID vaccinations. Continue taking your medications as prescribed and follow up with any new symptoms or concerns.                      Contains text generated by [employer].                                  Contains text generated by [employer].

## 2024-05-31 NOTE — Progress Notes (Signed)
 Subjective  CC:  Chief Complaint  Patient presents with   Hypertension   ADHD   Diabetes    HPI: George Campbell is a 53 y.o. male who presents to the office today for follow up of diabetes and problems listed above in the chief complaint.  Discussed the use of AI scribe software for clinical note transcription with the patient, who gave verbal consent to proceed.  History of Present Illness George Campbell is a 53 year old male who presents for medication refills and routine follow-up.  Weight gain and lifestyle changes - Gained 20 pounds, current weight 206 pounds - Attributes weight gain to decreased physical activity and increased food intake, particularly during vacations to the [place] and [place]   Diabetes - Hemoglobin A1c is 5.5, diet controlled.  No symptoms on hyperglycemia.  Reports normal eye exam.  [facility].  Need records.  No retinopathy.  No neuropathy.  Alcohol use disorder - History of alcohol addiction - Celebrating five years of sobriety! - No longer experiences significant alcohol cravings - Attributes sobriety to strong mindset and previous use of opioid blocker injection for one year  Attention deficit disorder and stimulant use - Currently taking Adderall XR 20 mg in the morning and at night - Prefers immediate release formulation on some days for relaxation - Continues to work well.  Hypertension  - Mildly elevated today.  Likely due to weight gain.  Work on better diet and weight loss.  Continue amlodipine  and lisinopril -HCTZ.  Chronic obstructive pulmonary disease and vascular calcification - Previous lung cancer screening showed COPD changes and aortic calcification, discussed these findings.  Patient is asymptomatic  Health maintenance: Due for lung cancer screening and colorectal cancer screening with Cologuard.    Wt Readings from Last 3 Encounters:  05/31/24 205 lb 12.8 oz (93.4 kg)  11/24/23 195 lb (88.5 kg)   05/25/23 200 lb 3.2 oz (90.8 kg)    BP Readings from Last 3 Encounters:  05/31/24 130/86  11/24/23 118/78  05/25/23 122/72    Assessment  1. Type 2 diabetes mellitus without complication, without long-term current use of insulin (HCC)   2. Need for influenza vaccination   3. Attention deficit disorder (ADD) without hyperactivity   4. Hypertension associated with type 2 diabetes mellitus (HCC)   5. Screening for colorectal cancer   6. Chronic obstructive pulmonary disease with emphysema, unspecified emphysema type (HCC)   7. Type 2 diabetes mellitus with atherosclerosis of aorta (HCC)      Plan  Assessment and Plan Assessment & Plan Attention-deficit hyperactivity disorder (ADHD) Currently managed with Adderall XR 20 mg in the morning and 20 mg XR at night. Prefers an immediate release formulation for dosing flexibility. - Prescribe Adderall 20 mg immediate release once daily for dosing flexibility. - He will message me in a few weeks to see if the pharmacy has the immediate release.  If so, we will prescribe Adderall 20 IR twice daily.  Otherwise continue Adderall 20 XR twice daily.  Essential hypertension -Continue current medications.  Work on weight loss and better diet.  No changes today.  Will recheck in 6 months.  Just above goal today.  Chronic obstructive pulmonary disease (COPD) Previous lung cancer screening showed COPD changes, but he is asymptomatic. - Schedule lung cancer screening.  Cologuard ordered for colorectal cancer screening.  General Health Maintenance Discussed flu and COVID vaccinations. - Flu VAX given today. - Obtain COVID vaccination.    Follow up: 6 months  for complete physical and follow-up Orders Placed This Encounter  Procedures   Flu vaccine trivalent PF, 6mos and older(Flulaval,Afluria,Fluarix,Fluzone)   Cologuard   POCT HgB A1C   No orders of the defined types were placed in this encounter.     Immunization History   Administered Date(s) Administered   Hepatitis B, ADULT 06/21/2018, 07/24/2018   Influenza Inj Mdck Quad Pf 06/10/2017, 06/04/2019   Influenza Nasal 05/15/2013, 05/27/2014   Influenza, Quadrivalent, Recombinant, Inj, Pf 06/10/2017   Influenza, Seasonal, Injecte, Preservative Fre 06/02/2015, 05/31/2016, 05/03/2023, 05/31/2024   Influenza,inj,Quad PF,6+ Mos 06/02/2015, 05/31/2016, 06/21/2018, 05/02/2020, 05/07/2022   Influenza-Unspecified 06/21/2018, 06/04/2019, 07/14/2021   PFIZER(Purple Top)SARS-COV-2 Vaccination 10/25/2019, 11/22/2019, 05/29/2020, 04/10/2021   Pneumococcal Conjugate-13 08/05/2021   Pneumococcal Polysaccharide-23 09/14/2016   Tdap 09/06/2014, 06/10/2019   Zoster Recombinant(Shingrix ) 11/05/2022, 05/03/2023    Diabetes Related Lab Review: Lab Results  Component Value Date   HGBA1C 5.5 05/31/2024   HGBA1C 6.1 11/24/2023   HGBA1C 5.7 (A) 05/25/2023    Lab Results  Component Value Date   MICROALBUR <0.7 11/24/2023   Lab Results  Component Value Date   CREATININE 0.70 11/24/2023   BUN 12 11/24/2023   NA 137 11/24/2023   K 3.5 11/24/2023   CL 98 11/24/2023   CO2 32 11/24/2023   Lab Results  Component Value Date   CHOL 100 11/24/2023   CHOL 108 11/05/2022   CHOL 118 11/03/2021   Lab Results  Component Value Date   HDL 47.70 11/24/2023   HDL 49.40 11/05/2022   HDL 54.90 11/03/2021   Lab Results  Component Value Date   LDLCALC 42 11/24/2023   LDLCALC 48 11/05/2022   LDLCALC 53 11/03/2021   Lab Results  Component Value Date   TRIG 51.0 11/24/2023   TRIG 56.0 11/05/2022   TRIG 52.0 11/03/2021   Lab Results  Component Value Date   CHOLHDL 2 11/24/2023   CHOLHDL 2 11/05/2022   CHOLHDL 2 11/03/2021   No results found for: LDLDIRECT The ASCVD Risk score (Arnett DK, et al., 2019) failed to calculate for the following reasons:   The valid total cholesterol range is 130 to 320 mg/dL I have reviewed the PMH, Fam and Soc history. Patient Active  Problem List   Diagnosis Date Noted   COPD (chronic obstructive pulmonary disease) (HCC) by CT 05/31/2024    Priority: High   Type 2 diabetes mellitus with atherosclerosis of aorta (HCC) by CT 2024 05/31/2024    Priority: High   Former smoker 30 pak year quit 2017 11/05/2022    Priority: High    Started lung cancer screens 2024, age 40    Type 2 diabetes mellitus without complication, without long-term current use of insulin (HCC) 08/05/2021    Priority: High    Started metformin  09/2020; no statin due to excellent LDL < 60 naturally. Stopped met 06/2021, diet controlled    Alcoholism in recovery (HCC) 11/24/2017    Priority: High   Hypertension associated with type 2 diabetes mellitus (HCC) 03/25/2015    Priority: High    On ACE    Attention deficit disorder (ADD) without hyperactivity 04/17/2013    Priority: High    Adderall     Low serum low density lipoprotein (LDL) 05/07/2022    Priority: Medium     Not on statin; LDL in the 30s naturally    Fatty liver, alcoholic 10/05/2015    Priority: Medium     Liver ultrasound 2017, [place].     Social History: Patient  reports that he quit smoking about 8 years ago. His smoking use included cigarettes and e-cigarettes. He started smoking about 38 years ago. He has a 45 pack-year smoking history. He has never used smokeless tobacco. He reports that he does not currently use alcohol. He reports current drug use. Drug: Marijuana.  Review of Systems: Ophthalmic: negative for eye pain, loss of vision or double vision Cardiovascular: negative for chest pain Respiratory: negative for SOB or persistent cough Gastrointestinal: negative for abdominal pain Genitourinary: negative for dysuria or gross hematuria MSK: negative for foot lesions Neurologic: negative for weakness or gait disturbance  Objective  Vitals: BP 130/86   Pulse 66   Temp 97.7 F (36.5 C)   Ht 5' 10 (1.778 m)   Wt 205 lb 12.8 oz (93.4 kg)   SpO2 99%   BMI 29.53  kg/m  General: well appearing, no acute distress  Psych:  Alert and oriented, normal mood and affect Looks great. Diabetic education: ongoing education regarding chronic disease management for diabetes was given today. We continue to reinforce the ABC's of diabetic management: A1c (<7 or 8 dependent upon patient), tight blood pressure control, and cholesterol management with goal LDL < 100 minimally. We discuss diet strategies, exercise recommendations, medication options and possible side effects. At each visit, we review recommended immunizations and preventive care recommendations for diabetics and stress that good diabetic control can prevent other problems. See below for this patient's data. Commons side effects, risks, benefits, and alternatives for medications and treatment plan prescribed today were discussed, and the patient expressed understanding of the given instructions. Patient is instructed to call or message via MyChart if he/she has any questions or concerns regarding our treatment plan. No barriers to understanding were identified. We discussed Red Flag symptoms and signs in detail. Patient expressed understanding regarding what to do in case of urgent or emergency type symptoms.  Medication list was reconciled, printed and provided to the patient in AVS. Patient instructions and summary information was reviewed with the patient as documented in the AVS. This note was prepared with assistance of Dragon voice recognition software. Occasional wrong-word or sound-a-like substitutions may have occurred due to the inherent limitations of voice recognition software

## 2024-07-02 ENCOUNTER — Encounter: Payer: Self-pay | Admitting: Family Medicine

## 2024-07-02 NOTE — Telephone Encounter (Signed)
 Noted

## 2024-07-23 ENCOUNTER — Encounter: Payer: Self-pay | Admitting: Family Medicine

## 2024-07-24 MED ORDER — AMPHETAMINE-DEXTROAMPHETAMINE 30 MG PO TABS: 30.0000 mg | ORAL_TABLET | Freq: Every day | ORAL | 0 refills | Status: DC

## 2024-07-24 MED ORDER — AMPHETAMINE-DEXTROAMPHET ER 30 MG PO CP24: 30.0000 mg | ORAL_CAPSULE | ORAL | 0 refills | Status: DC

## 2024-08-21 MED ORDER — AMPHETAMINE-DEXTROAMPHETAMINE 30 MG PO TABS: 30.0000 mg | ORAL_TABLET | Freq: Every day | ORAL | 0 refills | Status: DC

## 2024-08-21 MED ORDER — AMPHETAMINE-DEXTROAMPHET ER 30 MG PO CP24: 30.0000 mg | ORAL_CAPSULE | ORAL | 0 refills | Status: DC

## 2024-08-21 NOTE — Addendum Note (Signed)
 Addended by: JODIE GAMMONS on: 08/21/2024 09:06 AM   Modules accepted: Orders

## 2024-09-18 ENCOUNTER — Encounter: Payer: Self-pay | Admitting: Family Medicine

## 2024-09-19 MED ORDER — AMPHETAMINE-DEXTROAMPHETAMINE 30 MG PO TABS: 30.0000 mg | ORAL_TABLET | Freq: Every day | ORAL | 0 refills | Status: AC

## 2024-09-19 MED ORDER — AMPHETAMINE-DEXTROAMPHET ER 30 MG PO CP24: 30.0000 mg | ORAL_CAPSULE | ORAL | 0 refills | Status: AC

## 2024-11-28 ENCOUNTER — Encounter: Admitting: Family Medicine

## 2024-12-04 ENCOUNTER — Encounter: Admitting: Family Medicine

## 2024-12-13 ENCOUNTER — Encounter: Admitting: Family Medicine
